# Patient Record
Sex: Male | Born: 1935 | Race: White | Hispanic: No | Marital: Married | Smoking: Former smoker
Health system: Southern US, Community
[De-identification: ages and names within clinical notes are randomized; demographics above are authoritative.]

## PROBLEM LIST (undated history)

## (undated) DIAGNOSIS — I2510 Atherosclerotic heart disease of native coronary artery without angina pectoris: Secondary | ICD-10-CM

## (undated) DIAGNOSIS — E7800 Pure hypercholesterolemia, unspecified: Secondary | ICD-10-CM

## (undated) DIAGNOSIS — E785 Hyperlipidemia, unspecified: Secondary | ICD-10-CM

## (undated) DIAGNOSIS — I219 Acute myocardial infarction, unspecified: Secondary | ICD-10-CM

## (undated) DIAGNOSIS — R351 Nocturia: Secondary | ICD-10-CM

## (undated) DIAGNOSIS — R001 Bradycardia, unspecified: Secondary | ICD-10-CM

## (undated) DIAGNOSIS — D649 Anemia, unspecified: Secondary | ICD-10-CM

## (undated) DIAGNOSIS — R600 Localized edema: Secondary | ICD-10-CM

## (undated) DIAGNOSIS — R0609 Other forms of dyspnea: Secondary | ICD-10-CM

## (undated) DIAGNOSIS — R0600 Dyspnea, unspecified: Secondary | ICD-10-CM

## (undated) DIAGNOSIS — N400 Enlarged prostate without lower urinary tract symptoms: Secondary | ICD-10-CM

## (undated) DIAGNOSIS — M549 Dorsalgia, unspecified: Secondary | ICD-10-CM

## (undated) DIAGNOSIS — I351 Nonrheumatic aortic (valve) insufficiency: Secondary | ICD-10-CM

## (undated) DIAGNOSIS — M25579 Pain in unspecified ankle and joints of unspecified foot: Secondary | ICD-10-CM

## (undated) DIAGNOSIS — I10 Essential (primary) hypertension: Secondary | ICD-10-CM

## (undated) DIAGNOSIS — M79606 Pain in leg, unspecified: Secondary | ICD-10-CM

## (undated) DIAGNOSIS — S42309A Unspecified fracture of shaft of humerus, unspecified arm, initial encounter for closed fracture: Secondary | ICD-10-CM

## (undated) DIAGNOSIS — R002 Palpitations: Secondary | ICD-10-CM

## (undated) DIAGNOSIS — J40 Bronchitis, not specified as acute or chronic: Secondary | ICD-10-CM

## (undated) DIAGNOSIS — IMO0001 Reserved for inherently not codable concepts without codable children: Secondary | ICD-10-CM

## (undated) HISTORY — DX: Localized edema: R60.0

## (undated) HISTORY — DX: Unspecified fracture of shaft of humerus, unspecified arm, initial encounter for closed fracture: S42.309A

## (undated) HISTORY — DX: Essential (primary) hypertension: I10

## (undated) HISTORY — DX: Palpitations: R00.2

## (undated) HISTORY — DX: Bradycardia, unspecified: R00.1

## (undated) HISTORY — DX: Pain in leg, unspecified: M79.606

## (undated) HISTORY — DX: Other forms of dyspnea: R06.09

## (undated) HISTORY — DX: Bronchitis, not specified as acute or chronic: J40

## (undated) HISTORY — PX: CHOLECYSTECTOMY: SHX55

## (undated) HISTORY — DX: Nonrheumatic aortic (valve) insufficiency: I35.1

## (undated) HISTORY — DX: Hyperlipidemia, unspecified: E78.5

## (undated) HISTORY — DX: Reserved for inherently not codable concepts without codable children: IMO0001

## (undated) HISTORY — DX: Acute myocardial infarction, unspecified: I21.9

## (undated) HISTORY — DX: Anemia, unspecified: D64.9

## (undated) HISTORY — DX: Dorsalgia, unspecified: M54.9

## (undated) HISTORY — DX: Benign prostatic hyperplasia without lower urinary tract symptoms: N40.0

## (undated) HISTORY — DX: Pain in unspecified ankle and joints of unspecified foot: M25.579

## (undated) HISTORY — PX: OTHER SURGICAL HISTORY: SHX169

## (undated) HISTORY — DX: Atherosclerotic heart disease of native coronary artery without angina pectoris: I25.10

## (undated) HISTORY — DX: Dyspnea, unspecified: R06.00

## (undated) HISTORY — DX: Pure hypercholesterolemia, unspecified: E78.00

## (undated) HISTORY — DX: Nocturia: R35.1

---

## 1996-06-01 DIAGNOSIS — D649 Anemia, unspecified: Secondary | ICD-10-CM

## 1996-06-01 HISTORY — DX: Anemia, unspecified: D64.9

## 1996-06-01 HISTORY — PX: CARDIAC CATHETERIZATION: SHX172

## 1996-06-04 HISTORY — PX: CORONARY ARTERY BYPASS GRAFT: SHX141

## 1999-11-15 ENCOUNTER — Emergency Department (HOSPITAL_COMMUNITY): Admission: EM | Admit: 1999-11-15 | Discharge: 1999-11-15 | Payer: Self-pay | Admitting: *Deleted

## 2004-02-02 ENCOUNTER — Encounter: Admission: RE | Admit: 2004-02-02 | Discharge: 2004-02-02 | Payer: Self-pay | Admitting: Family Medicine

## 2004-02-02 DIAGNOSIS — J40 Bronchitis, not specified as acute or chronic: Secondary | ICD-10-CM

## 2004-02-02 HISTORY — DX: Bronchitis, not specified as acute or chronic: J40

## 2004-06-16 ENCOUNTER — Emergency Department (HOSPITAL_COMMUNITY): Admission: EM | Admit: 2004-06-16 | Discharge: 2004-06-16 | Payer: Self-pay

## 2012-03-24 DIAGNOSIS — E782 Mixed hyperlipidemia: Secondary | ICD-10-CM | POA: Diagnosis not present

## 2012-03-24 DIAGNOSIS — I2510 Atherosclerotic heart disease of native coronary artery without angina pectoris: Secondary | ICD-10-CM | POA: Diagnosis not present

## 2012-03-24 DIAGNOSIS — I10 Essential (primary) hypertension: Secondary | ICD-10-CM | POA: Diagnosis not present

## 2012-04-02 DIAGNOSIS — I10 Essential (primary) hypertension: Secondary | ICD-10-CM | POA: Diagnosis not present

## 2012-04-02 DIAGNOSIS — I2510 Atherosclerotic heart disease of native coronary artery without angina pectoris: Secondary | ICD-10-CM | POA: Diagnosis not present

## 2012-04-02 DIAGNOSIS — E782 Mixed hyperlipidemia: Secondary | ICD-10-CM | POA: Diagnosis not present

## 2012-06-03 DIAGNOSIS — S4980XA Other specified injuries of shoulder and upper arm, unspecified arm, initial encounter: Secondary | ICD-10-CM | POA: Diagnosis not present

## 2012-06-03 DIAGNOSIS — M542 Cervicalgia: Secondary | ICD-10-CM | POA: Diagnosis not present

## 2012-06-03 DIAGNOSIS — S0993XA Unspecified injury of face, initial encounter: Secondary | ICD-10-CM | POA: Diagnosis not present

## 2012-06-03 DIAGNOSIS — M25519 Pain in unspecified shoulder: Secondary | ICD-10-CM | POA: Diagnosis not present

## 2012-06-03 DIAGNOSIS — M259 Joint disorder, unspecified: Secondary | ICD-10-CM | POA: Diagnosis not present

## 2012-06-03 DIAGNOSIS — M47812 Spondylosis without myelopathy or radiculopathy, cervical region: Secondary | ICD-10-CM | POA: Diagnosis not present

## 2012-06-03 DIAGNOSIS — M19019 Primary osteoarthritis, unspecified shoulder: Secondary | ICD-10-CM | POA: Diagnosis not present

## 2012-06-04 DIAGNOSIS — M62838 Other muscle spasm: Secondary | ICD-10-CM | POA: Diagnosis not present

## 2012-06-04 DIAGNOSIS — M999 Biomechanical lesion, unspecified: Secondary | ICD-10-CM | POA: Diagnosis not present

## 2012-06-04 DIAGNOSIS — M5030 Other cervical disc degeneration, unspecified cervical region: Secondary | ICD-10-CM | POA: Diagnosis not present

## 2012-06-04 DIAGNOSIS — M9981 Other biomechanical lesions of cervical region: Secondary | ICD-10-CM | POA: Diagnosis not present

## 2012-06-08 DIAGNOSIS — M5030 Other cervical disc degeneration, unspecified cervical region: Secondary | ICD-10-CM | POA: Diagnosis not present

## 2012-06-08 DIAGNOSIS — M999 Biomechanical lesion, unspecified: Secondary | ICD-10-CM | POA: Diagnosis not present

## 2012-06-08 DIAGNOSIS — M62838 Other muscle spasm: Secondary | ICD-10-CM | POA: Diagnosis not present

## 2012-06-08 DIAGNOSIS — M9981 Other biomechanical lesions of cervical region: Secondary | ICD-10-CM | POA: Diagnosis not present

## 2012-06-09 DIAGNOSIS — M999 Biomechanical lesion, unspecified: Secondary | ICD-10-CM | POA: Diagnosis not present

## 2012-06-09 DIAGNOSIS — M5030 Other cervical disc degeneration, unspecified cervical region: Secondary | ICD-10-CM | POA: Diagnosis not present

## 2012-06-09 DIAGNOSIS — M9981 Other biomechanical lesions of cervical region: Secondary | ICD-10-CM | POA: Diagnosis not present

## 2012-06-09 DIAGNOSIS — M62838 Other muscle spasm: Secondary | ICD-10-CM | POA: Diagnosis not present

## 2012-07-21 DIAGNOSIS — M542 Cervicalgia: Secondary | ICD-10-CM | POA: Diagnosis not present

## 2012-07-21 DIAGNOSIS — M25519 Pain in unspecified shoulder: Secondary | ICD-10-CM | POA: Diagnosis not present

## 2012-07-30 DIAGNOSIS — M5000 Cervical disc disorder with myelopathy, unspecified cervical region: Secondary | ICD-10-CM | POA: Diagnosis not present

## 2012-09-14 DIAGNOSIS — Z23 Encounter for immunization: Secondary | ICD-10-CM | POA: Diagnosis not present

## 2012-11-19 DIAGNOSIS — I2510 Atherosclerotic heart disease of native coronary artery without angina pectoris: Secondary | ICD-10-CM | POA: Diagnosis not present

## 2012-11-19 DIAGNOSIS — M79609 Pain in unspecified limb: Secondary | ICD-10-CM | POA: Diagnosis not present

## 2012-11-25 DIAGNOSIS — M1710 Unilateral primary osteoarthritis, unspecified knee: Secondary | ICD-10-CM | POA: Diagnosis not present

## 2012-11-25 DIAGNOSIS — M79609 Pain in unspecified limb: Secondary | ICD-10-CM | POA: Diagnosis not present

## 2012-11-25 DIAGNOSIS — M19079 Primary osteoarthritis, unspecified ankle and foot: Secondary | ICD-10-CM | POA: Diagnosis not present

## 2012-11-25 DIAGNOSIS — M259 Joint disorder, unspecified: Secondary | ICD-10-CM | POA: Diagnosis not present

## 2012-12-25 DIAGNOSIS — J40 Bronchitis, not specified as acute or chronic: Secondary | ICD-10-CM | POA: Diagnosis not present

## 2012-12-25 DIAGNOSIS — R509 Fever, unspecified: Secondary | ICD-10-CM | POA: Diagnosis not present

## 2013-02-19 DIAGNOSIS — H2510 Age-related nuclear cataract, unspecified eye: Secondary | ICD-10-CM | POA: Diagnosis not present

## 2013-02-19 DIAGNOSIS — H524 Presbyopia: Secondary | ICD-10-CM | POA: Diagnosis not present

## 2013-03-09 DIAGNOSIS — M999 Biomechanical lesion, unspecified: Secondary | ICD-10-CM | POA: Diagnosis not present

## 2013-03-09 DIAGNOSIS — M62838 Other muscle spasm: Secondary | ICD-10-CM | POA: Diagnosis not present

## 2013-03-09 DIAGNOSIS — M5030 Other cervical disc degeneration, unspecified cervical region: Secondary | ICD-10-CM | POA: Diagnosis not present

## 2013-03-09 DIAGNOSIS — M9981 Other biomechanical lesions of cervical region: Secondary | ICD-10-CM | POA: Diagnosis not present

## 2013-03-10 DIAGNOSIS — M5030 Other cervical disc degeneration, unspecified cervical region: Secondary | ICD-10-CM | POA: Diagnosis not present

## 2013-03-10 DIAGNOSIS — M9981 Other biomechanical lesions of cervical region: Secondary | ICD-10-CM | POA: Diagnosis not present

## 2013-03-10 DIAGNOSIS — M999 Biomechanical lesion, unspecified: Secondary | ICD-10-CM | POA: Diagnosis not present

## 2013-03-10 DIAGNOSIS — M62838 Other muscle spasm: Secondary | ICD-10-CM | POA: Diagnosis not present

## 2013-03-11 DIAGNOSIS — M62838 Other muscle spasm: Secondary | ICD-10-CM | POA: Diagnosis not present

## 2013-03-11 DIAGNOSIS — M999 Biomechanical lesion, unspecified: Secondary | ICD-10-CM | POA: Diagnosis not present

## 2013-03-11 DIAGNOSIS — M5030 Other cervical disc degeneration, unspecified cervical region: Secondary | ICD-10-CM | POA: Diagnosis not present

## 2013-03-11 DIAGNOSIS — M9981 Other biomechanical lesions of cervical region: Secondary | ICD-10-CM | POA: Diagnosis not present

## 2013-03-16 DIAGNOSIS — M9981 Other biomechanical lesions of cervical region: Secondary | ICD-10-CM | POA: Diagnosis not present

## 2013-03-16 DIAGNOSIS — M62838 Other muscle spasm: Secondary | ICD-10-CM | POA: Diagnosis not present

## 2013-03-16 DIAGNOSIS — M5030 Other cervical disc degeneration, unspecified cervical region: Secondary | ICD-10-CM | POA: Diagnosis not present

## 2013-03-16 DIAGNOSIS — M999 Biomechanical lesion, unspecified: Secondary | ICD-10-CM | POA: Diagnosis not present

## 2013-03-18 DIAGNOSIS — H353190 Nonexudative age-related macular degeneration, unspecified eye, stage unspecified: Secondary | ICD-10-CM | POA: Diagnosis not present

## 2013-03-18 DIAGNOSIS — H25019 Cortical age-related cataract, unspecified eye: Secondary | ICD-10-CM | POA: Diagnosis not present

## 2013-03-23 DIAGNOSIS — M9981 Other biomechanical lesions of cervical region: Secondary | ICD-10-CM | POA: Diagnosis not present

## 2013-03-23 DIAGNOSIS — M999 Biomechanical lesion, unspecified: Secondary | ICD-10-CM | POA: Diagnosis not present

## 2013-03-23 DIAGNOSIS — M62838 Other muscle spasm: Secondary | ICD-10-CM | POA: Diagnosis not present

## 2013-03-23 DIAGNOSIS — M5030 Other cervical disc degeneration, unspecified cervical region: Secondary | ICD-10-CM | POA: Diagnosis not present

## 2013-03-24 DIAGNOSIS — J069 Acute upper respiratory infection, unspecified: Secondary | ICD-10-CM | POA: Diagnosis not present

## 2013-03-24 DIAGNOSIS — R109 Unspecified abdominal pain: Secondary | ICD-10-CM | POA: Diagnosis not present

## 2013-04-07 DIAGNOSIS — R001 Bradycardia, unspecified: Secondary | ICD-10-CM

## 2013-04-07 DIAGNOSIS — I2510 Atherosclerotic heart disease of native coronary artery without angina pectoris: Secondary | ICD-10-CM | POA: Diagnosis not present

## 2013-04-07 DIAGNOSIS — I10 Essential (primary) hypertension: Secondary | ICD-10-CM | POA: Diagnosis not present

## 2013-04-07 DIAGNOSIS — E782 Mixed hyperlipidemia: Secondary | ICD-10-CM | POA: Diagnosis not present

## 2013-04-07 HISTORY — DX: Bradycardia, unspecified: R00.1

## 2013-04-13 DIAGNOSIS — M9981 Other biomechanical lesions of cervical region: Secondary | ICD-10-CM | POA: Diagnosis not present

## 2013-04-13 DIAGNOSIS — M5030 Other cervical disc degeneration, unspecified cervical region: Secondary | ICD-10-CM | POA: Diagnosis not present

## 2013-04-13 DIAGNOSIS — M999 Biomechanical lesion, unspecified: Secondary | ICD-10-CM | POA: Diagnosis not present

## 2013-04-13 DIAGNOSIS — M62838 Other muscle spasm: Secondary | ICD-10-CM | POA: Diagnosis not present

## 2013-04-20 ENCOUNTER — Other Ambulatory Visit: Payer: Self-pay | Admitting: *Deleted

## 2013-04-20 MED ORDER — METOPROLOL SUCCINATE ER 25 MG PO TB24: 25.0000 mg | ORAL_TABLET | ORAL | Status: DC

## 2013-04-20 NOTE — Telephone Encounter (Signed)
Electronic refill for metoprolol

## 2013-04-21 ENCOUNTER — Other Ambulatory Visit: Payer: Self-pay | Admitting: *Deleted

## 2013-04-21 MED ORDER — FOSINOPRIL SODIUM 10 MG PO TABS: 10.0000 mg | ORAL_TABLET | Freq: Every day | ORAL | Status: DC

## 2013-05-11 DIAGNOSIS — IMO0002 Reserved for concepts with insufficient information to code with codable children: Secondary | ICD-10-CM | POA: Diagnosis not present

## 2013-05-11 DIAGNOSIS — M9981 Other biomechanical lesions of cervical region: Secondary | ICD-10-CM | POA: Diagnosis not present

## 2013-05-11 DIAGNOSIS — M999 Biomechanical lesion, unspecified: Secondary | ICD-10-CM | POA: Diagnosis not present

## 2013-05-11 DIAGNOSIS — M5030 Other cervical disc degeneration, unspecified cervical region: Secondary | ICD-10-CM | POA: Diagnosis not present

## 2013-05-12 DIAGNOSIS — M9981 Other biomechanical lesions of cervical region: Secondary | ICD-10-CM | POA: Diagnosis not present

## 2013-05-12 DIAGNOSIS — IMO0002 Reserved for concepts with insufficient information to code with codable children: Secondary | ICD-10-CM | POA: Diagnosis not present

## 2013-05-12 DIAGNOSIS — M5030 Other cervical disc degeneration, unspecified cervical region: Secondary | ICD-10-CM | POA: Diagnosis not present

## 2013-05-12 DIAGNOSIS — M999 Biomechanical lesion, unspecified: Secondary | ICD-10-CM | POA: Diagnosis not present

## 2013-05-13 DIAGNOSIS — M9981 Other biomechanical lesions of cervical region: Secondary | ICD-10-CM | POA: Diagnosis not present

## 2013-05-13 DIAGNOSIS — M5030 Other cervical disc degeneration, unspecified cervical region: Secondary | ICD-10-CM | POA: Diagnosis not present

## 2013-05-13 DIAGNOSIS — M999 Biomechanical lesion, unspecified: Secondary | ICD-10-CM | POA: Diagnosis not present

## 2013-05-13 DIAGNOSIS — IMO0002 Reserved for concepts with insufficient information to code with codable children: Secondary | ICD-10-CM | POA: Diagnosis not present

## 2013-05-19 DIAGNOSIS — M9981 Other biomechanical lesions of cervical region: Secondary | ICD-10-CM | POA: Diagnosis not present

## 2013-05-19 DIAGNOSIS — M999 Biomechanical lesion, unspecified: Secondary | ICD-10-CM | POA: Diagnosis not present

## 2013-05-19 DIAGNOSIS — M5030 Other cervical disc degeneration, unspecified cervical region: Secondary | ICD-10-CM | POA: Diagnosis not present

## 2013-05-19 DIAGNOSIS — IMO0002 Reserved for concepts with insufficient information to code with codable children: Secondary | ICD-10-CM | POA: Diagnosis not present

## 2013-05-20 DIAGNOSIS — IMO0002 Reserved for concepts with insufficient information to code with codable children: Secondary | ICD-10-CM | POA: Diagnosis not present

## 2013-05-20 DIAGNOSIS — M5030 Other cervical disc degeneration, unspecified cervical region: Secondary | ICD-10-CM | POA: Diagnosis not present

## 2013-05-20 DIAGNOSIS — M999 Biomechanical lesion, unspecified: Secondary | ICD-10-CM | POA: Diagnosis not present

## 2013-05-20 DIAGNOSIS — M9981 Other biomechanical lesions of cervical region: Secondary | ICD-10-CM | POA: Diagnosis not present

## 2013-05-24 DIAGNOSIS — M999 Biomechanical lesion, unspecified: Secondary | ICD-10-CM | POA: Diagnosis not present

## 2013-05-24 DIAGNOSIS — M5030 Other cervical disc degeneration, unspecified cervical region: Secondary | ICD-10-CM | POA: Diagnosis not present

## 2013-05-24 DIAGNOSIS — M9981 Other biomechanical lesions of cervical region: Secondary | ICD-10-CM | POA: Diagnosis not present

## 2013-05-24 DIAGNOSIS — IMO0002 Reserved for concepts with insufficient information to code with codable children: Secondary | ICD-10-CM | POA: Diagnosis not present

## 2013-05-26 DIAGNOSIS — M5030 Other cervical disc degeneration, unspecified cervical region: Secondary | ICD-10-CM | POA: Diagnosis not present

## 2013-05-26 DIAGNOSIS — M999 Biomechanical lesion, unspecified: Secondary | ICD-10-CM | POA: Diagnosis not present

## 2013-05-26 DIAGNOSIS — M9981 Other biomechanical lesions of cervical region: Secondary | ICD-10-CM | POA: Diagnosis not present

## 2013-05-26 DIAGNOSIS — IMO0002 Reserved for concepts with insufficient information to code with codable children: Secondary | ICD-10-CM | POA: Diagnosis not present

## 2013-05-27 DIAGNOSIS — IMO0002 Reserved for concepts with insufficient information to code with codable children: Secondary | ICD-10-CM | POA: Diagnosis not present

## 2013-05-27 DIAGNOSIS — M5030 Other cervical disc degeneration, unspecified cervical region: Secondary | ICD-10-CM | POA: Diagnosis not present

## 2013-05-27 DIAGNOSIS — M999 Biomechanical lesion, unspecified: Secondary | ICD-10-CM | POA: Diagnosis not present

## 2013-05-27 DIAGNOSIS — M9981 Other biomechanical lesions of cervical region: Secondary | ICD-10-CM | POA: Diagnosis not present

## 2013-06-04 DIAGNOSIS — M545 Low back pain, unspecified: Secondary | ICD-10-CM | POA: Diagnosis not present

## 2013-06-04 DIAGNOSIS — J019 Acute sinusitis, unspecified: Secondary | ICD-10-CM | POA: Diagnosis not present

## 2013-09-16 DIAGNOSIS — Z23 Encounter for immunization: Secondary | ICD-10-CM | POA: Diagnosis not present

## 2013-09-27 DIAGNOSIS — J329 Chronic sinusitis, unspecified: Secondary | ICD-10-CM | POA: Diagnosis not present

## 2013-09-27 DIAGNOSIS — F411 Generalized anxiety disorder: Secondary | ICD-10-CM | POA: Diagnosis not present

## 2013-12-15 DIAGNOSIS — F411 Generalized anxiety disorder: Secondary | ICD-10-CM | POA: Diagnosis not present

## 2013-12-15 DIAGNOSIS — J40 Bronchitis, not specified as acute or chronic: Secondary | ICD-10-CM | POA: Diagnosis not present

## 2013-12-15 DIAGNOSIS — R059 Cough, unspecified: Secondary | ICD-10-CM | POA: Diagnosis not present

## 2013-12-15 DIAGNOSIS — R05 Cough: Secondary | ICD-10-CM | POA: Diagnosis not present

## 2013-12-22 DIAGNOSIS — I2510 Atherosclerotic heart disease of native coronary artery without angina pectoris: Secondary | ICD-10-CM | POA: Diagnosis not present

## 2013-12-22 DIAGNOSIS — R42 Dizziness and giddiness: Secondary | ICD-10-CM | POA: Diagnosis not present

## 2013-12-22 DIAGNOSIS — H6120 Impacted cerumen, unspecified ear: Secondary | ICD-10-CM | POA: Diagnosis not present

## 2013-12-22 DIAGNOSIS — R110 Nausea: Secondary | ICD-10-CM | POA: Diagnosis not present

## 2014-03-24 ENCOUNTER — Telehealth: Payer: Self-pay | Admitting: Cardiovascular Disease

## 2014-03-24 MED ORDER — FOSINOPRIL SODIUM 10 MG PO TABS: 10.0000 mg | ORAL_TABLET | Freq: Every day | ORAL | Status: DC

## 2014-03-24 NOTE — Telephone Encounter (Signed)
Rx was sent to pharmacy electronically. 

## 2014-03-24 NOTE — Telephone Encounter (Signed)
Need refill on his Fosinopril 10mg  #90.

## 2014-03-25 ENCOUNTER — Telehealth: Payer: Self-pay | Admitting: Cardiovascular Disease

## 2014-03-25 NOTE — Telephone Encounter (Signed)
Forwarded to kathryn 

## 2014-03-25 NOTE — Telephone Encounter (Signed)
Returning [place] call from yesterday.

## 2014-03-28 NOTE — Telephone Encounter (Signed)
Would like two handicap forms filled out to get a sticker for his two cars.  Will bring by this PM and we can mail back to him when complete.

## 2014-03-28 NOTE — Telephone Encounter (Signed)
Pt called again,still waiting on his handicap papers.

## 2014-03-30 ENCOUNTER — Other Ambulatory Visit: Payer: Self-pay

## 2014-03-30 MED ORDER — FOSINOPRIL SODIUM 10 MG PO TABS: 10.0000 mg | ORAL_TABLET | Freq: Every day | ORAL | Status: DC

## 2014-03-30 NOTE — Telephone Encounter (Signed)
Rx was sent to pharmacy electronically. 

## 2014-03-31 ENCOUNTER — Telehealth: Payer: Self-pay | Admitting: Cardiovascular Disease

## 2014-03-31 NOTE — Telephone Encounter (Signed)
Please call-pt says he still have not gotten his handicap forms back.

## 2014-03-31 NOTE — Telephone Encounter (Signed)
Message forwarded to Kathryn, RN.  

## 2014-03-31 NOTE — Telephone Encounter (Signed)
Lmom  I was under the understanding that he was going to bring by another form for me to fill out and then I was to mail the forms to him.  Does he want me to mail the one handicapp placard form that I have or wait on the other.

## 2014-04-01 NOTE — Telephone Encounter (Signed)
Placard mailed

## 2014-04-01 NOTE — Telephone Encounter (Signed)
Pt was calling in regards to his placard. He wants you to know that the form you have now he stated that he wants 2 of the placards, but he misread the paperwork. He had 2 separate forms, one is not due until June. He wants the one that he gave to you be mailed out and he will bring the other one in at his next appointment.  JB

## 2014-04-11 ENCOUNTER — Telehealth: Payer: Self-pay | Admitting: *Deleted

## 2014-04-11 NOTE — Telephone Encounter (Signed)
Pt did receive the form and wanted you to know. Pt stated that he wanted you to mail the other form to him as well.  JB

## 2014-04-12 ENCOUNTER — Telehealth: Payer: Self-pay | Admitting: *Deleted

## 2014-04-12 NOTE — Telephone Encounter (Signed)
Second handicap placard form mailed to patient per his request

## 2014-04-12 NOTE — Telephone Encounter (Signed)
Second placard mailed

## 2014-05-09 ENCOUNTER — Encounter: Payer: Self-pay | Admitting: Cardiovascular Disease

## 2014-05-10 ENCOUNTER — Ambulatory Visit (INDEPENDENT_AMBULATORY_CARE_PROVIDER_SITE_OTHER): Payer: Medicare Other | Admitting: Cardiovascular Disease

## 2014-05-10 ENCOUNTER — Encounter: Payer: Self-pay | Admitting: Cardiovascular Disease

## 2014-05-10 VITALS — BP 114/68 | HR 50 | Ht 69.0 in | Wt 173.8 lb

## 2014-05-10 DIAGNOSIS — E785 Hyperlipidemia, unspecified: Secondary | ICD-10-CM | POA: Diagnosis not present

## 2014-05-10 DIAGNOSIS — I10 Essential (primary) hypertension: Secondary | ICD-10-CM | POA: Diagnosis not present

## 2014-05-10 DIAGNOSIS — I2510 Atherosclerotic heart disease of native coronary artery without angina pectoris: Secondary | ICD-10-CM

## 2014-05-10 NOTE — Assessment & Plan Note (Signed)
History of CAD status post coronary artery bypass grafting in 1997 with a LIMA to his LAD, vein to diagonal branch, marginal branch, PDA and distal circumflex coronary artery. His last Myoview stress test performed 03/24/12 with nonischemic. He denies chest pain or shortness of breath

## 2014-05-10 NOTE — Progress Notes (Signed)
05/10/2014 Randall Compton   Feb 01, 1932  [id number]  Primary Physician No PCP Per Patient Primary Cardiologist: Lorretta Harp MD Renae Gloss   HPI:  The patient is a very pleasant, 80 year old, mildly overweight, married Caucasian male, father of 16, grandfather to 3 grandchildren who I saw a year ago. I have been taking care of him since 1997. He has a history of coronary artery disease status post bypass grafting in 1997 with a LIMA to his LAD, a vein to the diagonal branch, a marginal branch, PDA, as well as distal circumflex. His other problems include hypertension and hyperlipidemia. He denies chest pain or shortness of breath. He had a Myoview performed 03/24/12  was nonischemic. He does have mildly elevated cholesterol but he is statin intolerant.    Current Outpatient Prescriptions  Medication Sig Dispense Refill  . Coenzyme Q10 (CO Q 10 PO) Take 1 capsule by mouth daily.      . diazepam (VALIUM) 5 MG tablet Take 5 mg by mouth as needed for anxiety.      . fosinopril (MONOPRIL) 10 MG tablet Take 1 tablet (10 mg total) by mouth daily. MUST KEEP APPOINTMENT WITH DR. Gwenlyn Found 6/2  90 tablet  0  . loratadine (CLARITIN) 10 MG tablet Take 10 mg by mouth daily as needed for allergies.      . metoprolol succinate (TOPROL-XL) 25 MG 24 hr tablet Take 12.5 mg by mouth daily.      . Multiple Vitamin (MULTIVITAMIN) capsule Take 1 capsule by mouth daily.      [name] omeprazole (PRILOSEC) 40 MG capsule Take 40 mg by mouth daily as needed.       [name] VITAMIN D, CHOLECALCIFEROL, PO Take 1 capsule by mouth daily.       No current facility-administered medications for this visit.    Allergies  Allergen Reactions  . Asa [Aspirin] Other (See Comments)    GI upset  . Lovaza [Omega-3-Acid Ethyl Esters]   . Statins   . Tricor [Fenofibrate] Other (See Comments)    Joint swelling    History   Social History  . Marital Status: Married    Spouse Name: N/A    Number of Children: N/A  . Years  of Education: N/A   Occupational History  . Not on file.   Social History Main Topics  . Smoking status: Former Smoker    Quit date: 12/09/1972  . Smokeless tobacco: Not on file  . Alcohol Use: 1.2 oz/week    2 Cans of beer per week     Comment: Not often  . Drug Use: No  . Sexual Activity: Not on file   Other Topics Concern  . Not on file   Social History Narrative  . No narrative on file     Review of Systems: General: negative for chills, fever, night sweats or weight changes.  Cardiovascular: negative for chest pain, dyspnea on exertion, edema, orthopnea, palpitations, paroxysmal nocturnal dyspnea or shortness of breath Dermatological: negative for rash Respiratory: negative for cough or wheezing Urologic: negative for hematuria Abdominal: negative for nausea, vomiting, diarrhea, bright red blood per rectum, melena, or hematemesis Neurologic: negative for visual changes, syncope, or dizziness All other systems reviewed and are otherwise negative except as noted above.    Blood pressure 114/68, pulse 50, height 5\' 9"  (1.753 m), weight 173 lb 12.8 oz (78.835 kg).  General appearance: alert and no distress Neck: no adenopathy, no carotid bruit, no JVD, supple, symmetrical, trachea  midline and thyroid not enlarged, symmetric, no tenderness/mass/nodules Lungs: clear to auscultation bilaterally Heart: regular rate and rhythm, S1, S2 normal, no murmur, click, rub or gallop Extremities: extremities normal, atraumatic, no cyanosis or edema  EKG sinus bradycardia at 50  ASSESSMENT AND PLAN:   Hyperlipidemia Statin intolerant  Essential hypertension Controlled on current medications  Coronary artery disease History of CAD status post coronary artery bypass grafting in 1997 with a LIMA to his LAD, vein to diagonal branch, marginal branch, PDA and distal circumflex coronary artery. His last Myoview stress test performed 03/24/12 with nonischemic. He denies chest pain or  shortness of breath      Lorretta Harp MD [facility], [facility] 05/10/2014 10:49 AM

## 2014-05-10 NOTE — Assessment & Plan Note (Signed)
Statin intolerant 

## 2014-05-10 NOTE — Patient Instructions (Signed)
Your physician recommends that you schedule a follow-up appointment in: 1 Year  

## 2014-05-10 NOTE — Assessment & Plan Note (Signed)
Controlled on current medications 

## 2014-06-27 ENCOUNTER — Other Ambulatory Visit: Payer: Self-pay | Admitting: Cardiovascular Disease

## 2014-06-27 NOTE — Telephone Encounter (Signed)
Rx refill sent to pharmacy. 

## 2014-08-22 ENCOUNTER — Emergency Department (INDEPENDENT_AMBULATORY_CARE_PROVIDER_SITE_OTHER)
Admission: EM | Admit: 2014-08-22 | Discharge: 2014-08-22 | Disposition: A | Discharge status: Home / Self Care | Payer: Medicare Other | Source: Home / Self Care

## 2014-08-22 ENCOUNTER — Encounter: Payer: Self-pay | Admitting: Emergency Medicine

## 2014-08-22 DIAGNOSIS — J0101 Acute recurrent maxillary sinusitis: Secondary | ICD-10-CM

## 2014-08-22 DIAGNOSIS — J0100 Acute maxillary sinusitis, unspecified: Secondary | ICD-10-CM

## 2014-08-22 MED ORDER — DIAZEPAM 5 MG PO TABS
ORAL_TABLET | ORAL | Status: DC
Start: 2014-08-22 — End: 2014-10-17

## 2014-08-22 MED ORDER — AMOXICILLIN-POT CLAVULANATE 875-125 MG PO TABS: 1.0000 | ORAL_TABLET | Freq: Two times a day (BID) | ORAL | Status: DC

## 2014-08-22 NOTE — Discharge Instructions (Signed)

## 2014-08-22 NOTE — ED Notes (Signed)
Randall Compton c/o 4 days of congestion, "raw" itchy throat, watery eyes and tinnitus. Denies fever.

## 2014-08-22 NOTE — ED Provider Notes (Signed)
CSN: [id number]     Arrival date & time 08/22/14  1420 History   None    Chief Complaint  Patient presents with  . Sinus Problem   (Consider location/radiation/quality/duration/timing/severity/associated sxs/prior Treatment) Patient is a 80 y.o. male presenting with sinus complaint. The history is provided by the patient. No language interpreter was used.  Sinus Problem This is a new problem. The current episode started more than 2 days ago. The problem occurs constantly. The problem has been gradually worsening. Pertinent negatives include no shortness of breath. Nothing aggravates the symptoms. Nothing relieves the symptoms. He has tried nothing for the symptoms.  Pt has a sore throat and sinus drainage.  Pt is also out of valium.   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 CAD (coronary artery disease)     s/p bypass grafting 06/01/96 with a LIMA to his LAD, a vein to the diagonal branch, a marginal branch, PDA, as well as distal circumflex. ECHO 07/21/96= Normal LV size, wall thickness & global systolic function, EF 40-98%.  Abnormal septal motion compatible with post-operative state w/o major wall motion abnormalities. Aortic sclerosis w/mild aortic regurgitation. Myoview 03/24/12 was non ischemic.  [name] Hypertension   . Bradycardia 04/07/13    EKG 04/07/13 showed sinus bradycardia at 54 bpm.  . Leg pain   . Ankle pain   . Back pain     Chronic  . Broken arm     Had pin placed  . Bronchitis 02/02/2004    Chest CT = Heart vascularity are normal. There is evidence of a previous CABG with some tortuosity of the thoracic aorta. There are no infiltrates or effusions, or other acute abnormalities.  . Postoperative anemia 06/01/96  . MI (myocardial infarction)     Prior to CABG 06/01/1996  . BPH (benign prostatic hyperplasia)   . DOE (dyspnea on exertion)   . Aortic sclerosis     With mild aortic regurgitation  . Mild aortic regurgitation     With aortic sclerosis   . Nocturia     Of 0-1  . Lower extremity edema     Late in the evening  . Palpitations     Occasional  . Hyperlipemia     Statin intolerant.  . Elevated cholesterol     Mild, statin intolerant.   Past Surgical History  Procedure Laterality Date  . Coronary artery bypass graft  06/04/96    Dr. Cyndia Bent placed LIMA to the LAD, SVG to the DX, SVG to the OM, & SVG to the PDA - distal CFX.  . Cardiac catheterization  06/01/96    60-70% distal (L) main, totally occluded LAD prior to the take off of the (L) of the diagonal w/thrombus present, multiple 60% lesions in the circumflex, & a totally occluded (R). Had a good collateral support to the distal RCA & distal LAD distribution but did have significant LV dysfunction w/EF of 30% & significant akinesia in the LAD distribution. Renals, distal aorta & iliacs were normal.   . Cholecystectomy    . Other surgical history      Broken arm, had pin placed   Family History  Problem Relation Age of Onset  . Heart disease Mother     Heart problems in her 64s (Pt did not specify)  . Hypertension Father    History  Substance Use Topics  . Smoking status: Former Smoker    Quit date: 12/09/1972  . Smokeless tobacco: Never  Used  . Alcohol Use: 1.2 oz/week    2 Cans of beer per week     Comment: Not often    Review of Systems  Respiratory: Positive for cough. Negative for shortness of breath.   All other systems reviewed and are negative.   Allergies  Asa; Lovaza; Statins; and Tricor  Home Medications   Prior to Admission medications   Medication Sig Start Date End Date Taking? Authorizing Provider  Coenzyme Q10 (CO Q 10 PO) Take 1 capsule by mouth daily.    Historical Provider, MD  diazepam (VALIUM) 5 MG tablet Take 5 mg by mouth as needed for anxiety.    Historical Provider, MD  fosinopril (MONOPRIL) 10 MG tablet Take 1 tablet (10 mg total) by mouth daily. 06/27/14   Lorretta Harp, MD  loratadine (CLARITIN) 10 MG tablet Take 10 mg by  mouth daily as needed for allergies.    Historical Provider, MD  metoprolol succinate (TOPROL-XL) 25 MG 24 hr tablet Take 12.5 mg by mouth daily. 04/20/13   Lorretta Harp, MD  Multiple Vitamin (MULTIVITAMIN) capsule Take 1 capsule by mouth daily.    Historical Provider, MD  omeprazole (PRILOSEC) 40 MG capsule Take 40 mg by mouth daily as needed.     Historical Provider, MD  VITAMIN D, CHOLECALCIFEROL, PO Take 1 capsule by mouth daily.    Historical Provider, MD   BP 143/51  Pulse 57  Temp(Src) 97.9 F (36.6 C) (Oral)  Resp 14  Ht 5\' 9"  (1.753 m)  Wt 178 lb (80.74 kg)  BMI 26.27 kg/m2  SpO2 98% Physical Exam  Nursing note and vitals reviewed. Constitutional: He is oriented to person, place, and time. He appears well-developed and well-nourished.  HENT:  Head: Normocephalic and atraumatic.  Eyes: EOM are normal. Pupils are equal, round, and reactive to light.  Neck: Normal range of motion.  Cardiovascular: Normal rate, regular rhythm and normal heart sounds.   Pulmonary/Chest: Effort normal.  Abdominal: Soft. He exhibits no distension.  Musculoskeletal: Normal range of motion.  Neurological: He is alert and oriented to person, place, and time.  Skin: Skin is warm.  Psychiatric: He has a normal mood and affect.    ED Course  Procedures (including critical care time) Labs Review Labs Reviewed - No data to display  Imaging Review No results found.   MDM   1. Acute recurrent maxillary sinusitis   augmentin Valium 5mg . Referral to [place] [facility] MD's    Fransico Meadow, PA-C 08/22/14 1500

## 2014-08-25 NOTE — ED Provider Notes (Signed)
Medical history/examination/treatment/procedure(s) were performed by non-physician provider and as supervising physician I was immediately available for consultation/collaboration.   Jacqulyn Cane, MD 08/25/14 [phone]

## 2014-09-14 DIAGNOSIS — Z23 Encounter for immunization: Secondary | ICD-10-CM | POA: Diagnosis not present

## 2014-09-15 ENCOUNTER — Other Ambulatory Visit: Payer: Self-pay | Admitting: Cardiovascular Disease

## 2014-09-19 NOTE — Telephone Encounter (Signed)
Rx was sent to pharmacy electronically. 

## 2014-09-28 ENCOUNTER — Telehealth: Payer: Self-pay | Admitting: Cardiovascular Disease

## 2014-09-28 MED ORDER — METOPROLOL SUCCINATE ER 25 MG PO TB24: 12.5000 mg | ORAL_TABLET | Freq: Every day | ORAL | Status: DC

## 2014-09-28 NOTE — Telephone Encounter (Signed)
Please call,pt wants to know about his Metoprolol, He usually get #90 now he can only get #15.

## 2014-09-28 NOTE — Telephone Encounter (Signed)
PATIENT IS REQUESTING A 90 DAY SUPPLY OF METOPROLOL SUCC. 12.5 MG DAILY.  PATIENT WILL TAKE 1/2 TABLET OF 25 MG  A DAY.

## 2014-10-06 ENCOUNTER — Encounter: Payer: Self-pay | Admitting: Emergency Medicine

## 2014-10-06 ENCOUNTER — Emergency Department (INDEPENDENT_AMBULATORY_CARE_PROVIDER_SITE_OTHER)
Admission: EM | Admit: 2014-10-06 | Discharge: 2014-10-06 | Disposition: A | Discharge status: Home / Self Care | Payer: Medicare Other | Source: Home / Self Care | Attending: Emergency Medicine | Admitting: Emergency Medicine

## 2014-10-06 DIAGNOSIS — J301 Allergic rhinitis due to pollen: Secondary | ICD-10-CM | POA: Diagnosis not present

## 2014-10-06 DIAGNOSIS — J0100 Acute maxillary sinusitis, unspecified: Secondary | ICD-10-CM

## 2014-10-06 MED ORDER — AZITHROMYCIN 250 MG PO TABS: ORAL_TABLET | ORAL | Status: DC

## 2014-10-06 MED ORDER — FLUTICASONE PROPIONATE 50 MCG/ACT NA SUSP: NASAL | Status: DC

## 2014-10-06 NOTE — ED Provider Notes (Signed)
CSN: [id number]     Arrival date & time 10/06/14  1402 History   First MD Initiated Contact with Patient 10/06/14 1420     Chief Complaint  Patient presents with  . Sinus Problem    HPI SINUSITIS  Onset: 4 days Facial/sinus pressure with mild discolored nasal mucus.    Severity: moderate, progressively worsening. Tried OTC Claritin , which helped some allergy symptoms but causes over drying if he takes every day. He recalls that in the past, Flonase has helped, but he requests a refill today because he's run out  He was seen here in urgent care by a different provider a month and a half ago for sinusitis, was prescribed Augmentin, and had mild throat swelling, so he stopped the Augmentin and the swelling resolved without sequelae.--We will note in chart now that he has Augmentin/amoxicillin allergy.  Symptoms:  + Fever  + URI prodrome with nasal congestion Denies swollen neck glands + mild Sinus Headache + mild bilateral ear pressure  Positive, seasonal Allergy symptoms No significant Sore Throat No eye symptoms, except mild itchy watery eyes     No significant Cough No chest pain No shortness of breath  No wheezing  No Abdominal Pain No Nausea No Vomiting No diarrhea  No Myalgias No focal neurologic symptoms No syncope No Rash  No Urinary symptoms   Past Medical History  Diagnosis Date  . CAD (coronary artery disease)     s/p bypass grafting 06/01/96 with a LIMA to his LAD, a vein to the diagonal branch, a marginal branch, PDA, as well as distal circumflex. ECHO 07/21/96= Normal LV size, wall thickness & global systolic function, EF 54-00%.  Abnormal septal motion compatible with post-operative state w/o major wall motion abnormalities. Aortic sclerosis w/mild aortic regurgitation. Myoview 03/24/12 was non ischemic.  [name] Hypertension   . Bradycardia 04/07/13    EKG 04/07/13 showed sinus bradycardia at 54 bpm.  . Leg pain   . Ankle pain   . Back pain     Chronic  .  Broken arm     Had pin placed  . Bronchitis 02/02/2004    Chest CT = Heart vascularity are normal. There is evidence of a previous CABG with some tortuosity of the thoracic aorta. There are no infiltrates or effusions, or other acute abnormalities.  . Postoperative anemia 06/01/96  . MI (myocardial infarction)     Prior to CABG 06/01/1996  . BPH (benign prostatic hyperplasia)   . DOE (dyspnea on exertion)   . Aortic sclerosis     With mild aortic regurgitation  . Mild aortic regurgitation     With aortic sclerosis  . Nocturia     Of 0-1  . Lower extremity edema     Late in the evening  . Palpitations     Occasional  . Hyperlipemia     Statin intolerant.  . Elevated cholesterol     Mild, statin intolerant.   Past Surgical History  Procedure Laterality Date  . Coronary artery bypass graft  06/04/96    Dr. Cyndia Bent placed LIMA to the LAD, SVG to the DX, SVG to the OM, & SVG to the PDA - distal CFX.  . Cardiac catheterization  06/01/96    60-70% distal (L) main, totally occluded LAD prior to the take off of the (L) of the diagonal w/thrombus present, multiple 60% lesions in the circumflex, & a totally occluded (R). Had a good collateral support to the distal RCA & distal LAD distribution  but did have significant LV dysfunction w/EF of 30% & significant akinesia in the LAD distribution. Renals, distal aorta & iliacs were normal.   . Cholecystectomy    . Other surgical history      Broken arm, had pin placed   Family History  Problem Relation Age of Onset  . Heart disease Mother     Heart problems in her 20s (Pt did not specify)  . Hypertension Father    History  Substance Use Topics  . Smoking status: Former Smoker    Quit date: 12/09/1972  . Smokeless tobacco: Never Used  . Alcohol Use: 1.2 oz/week    2 Cans of beer per week     Comment: Not often    Review of Systems  All other systems reviewed and are negative.   Allergies  Amoxicillin; Asa; Lovaza; Penicillins; Statins;  and Tricor  Home Medications   Prior to Admission medications   Medication Sig Start Date End Date Taking? Authorizing Provider  azithromycin (ZITHROMAX Z-PAK) 250 MG tablet Take 2 tablets today, then 1 tablet daily on days 2 through 5. Take with food 10/06/14   Jacqulyn Cane, MD  Coenzyme Q10 (CO Q 10 PO) Take 1 capsule by mouth daily.    Historical Provider, MD  diazepam (VALIUM) 5 MG tablet Take 5 mg by mouth as needed for anxiety.    Historical Provider, MD  diazepam (VALIUM) 5 MG tablet One tablet qhs prm difficulty sleeping 08/22/14   Fransico Meadow, PA-C  fluticasone [facility]) 50 MCG/ACT nasal spray 1 or 2 sprays each nostril twice a day 10/06/14   Jacqulyn Cane, MD  fosinopril (MONOPRIL) 10 MG tablet Take 1 tablet (10 mg total) by mouth daily. 06/27/14   Lorretta Harp, MD  loratadine (CLARITIN) 10 MG tablet Take 10 mg by mouth daily as needed for allergies.    Historical Provider, MD  metoprolol succinate (TOPROL-XL) 25 MG 24 hr tablet Take 12.5 mg by mouth daily. 04/20/13   Lorretta Harp, MD  metoprolol succinate (TOPROL-XL) 25 MG 24 hr tablet Take 0.5 tablets (12.5 mg total) by mouth daily. 09/28/14   Lorretta Harp, MD  Multiple Vitamin (MULTIVITAMIN) capsule Take 1 capsule by mouth daily.    Historical Provider, MD  omeprazole (PRILOSEC) 40 MG capsule Take 40 mg by mouth daily as needed.     Historical Provider, MD  VITAMIN D, CHOLECALCIFEROL, PO Take 1 capsule by mouth daily.    Historical Provider, MD   BP 137/72  Pulse 55  Temp(Src) 97.7 F (36.5 C) (Oral)  Ht 5\' 9"  (1.753 m)  Wt 170 lb (77.111 kg)  BMI 25.09 kg/m2  SpO2 98% Physical Exam  Nursing note and vitals reviewed. Constitutional: He is oriented to person, place, and time. He appears well-developed and well-nourished. No distress.  HENT:  Head: Normocephalic and atraumatic.  Right Ear: Tympanic membrane, external ear and ear canal normal.  Left Ear: Tympanic membrane, external ear and ear canal normal.   Nose: Mucosal edema and rhinorrhea present. Right sinus exhibits maxillary sinus tenderness. Left sinus exhibits maxillary sinus tenderness.  Mouth/Throat: Oropharynx is clear and moist. No oral lesions. No oropharyngeal exudate.  Nose: Mildly swollen, pale, boggy turbinates  Eyes: Right eye exhibits no discharge. Left eye exhibits no discharge. No scleral icterus.  Neck: Neck supple.  Cardiovascular: Normal rate, regular rhythm and normal heart sounds.   Pulmonary/Chest: Effort normal and breath sounds normal. He has no wheezes. He has no rales.  Lymphadenopathy:  He has no cervical adenopathy.  Neurological: He is alert and oriented to person, place, and time.  Skin: Skin is warm and dry.   no lesions   ED Course  Procedures (including critical care time) Labs Review Labs Reviewed - No data to display  Imaging Review No results found.   MDM   1. Acute maxillary sinusitis, recurrence not specified   2. Allergic rhinitis due to pollen    Treatment options discussed, as well as risks, benefits, alternatives. Patient voiced understanding and agreement with the following plans: Z-Pak Flonase He avoids decongestants because of his history of hypertension. We discussed Claritin. May take one half Claritin daily as long as it doesn't over dry, but avoid taking if it causes too much dryness. Follow-up with your primary care doctor in 5-7 days if not improving, or sooner if symptoms become worse.----He has no current PCP, so we assisted him with making an appointment with a PCP at primary [facility] Precautions discussed. Red flags discussed. Questions invited and answered. Patient voiced understanding and agreement.    Jacqulyn Cane, MD 10/06/14 [phone]

## 2014-10-06 NOTE — ED Notes (Signed)
Sinus pain, pressure, tinnitus, watery eyes, headaches, congestion, slight nausea was treated last week but felt like he was having allergic reaction to medicine and discontinued use.

## 2014-10-17 ENCOUNTER — Ambulatory Visit (INDEPENDENT_AMBULATORY_CARE_PROVIDER_SITE_OTHER): Payer: Medicare Other | Admitting: Family Medicine

## 2014-10-17 ENCOUNTER — Encounter: Payer: Self-pay | Admitting: Family Medicine

## 2014-10-17 VITALS — BP 126/63 | HR 58 | Ht 69.0 in | Wt 179.0 lb

## 2014-10-17 DIAGNOSIS — Z951 Presence of aortocoronary bypass graft: Secondary | ICD-10-CM | POA: Diagnosis not present

## 2014-10-17 DIAGNOSIS — Z8546 Personal history of malignant neoplasm of prostate: Secondary | ICD-10-CM | POA: Diagnosis not present

## 2014-10-17 DIAGNOSIS — E785 Hyperlipidemia, unspecified: Secondary | ICD-10-CM

## 2014-10-17 DIAGNOSIS — L821 Other seborrheic keratosis: Secondary | ICD-10-CM

## 2014-10-17 DIAGNOSIS — G4700 Insomnia, unspecified: Secondary | ICD-10-CM | POA: Diagnosis not present

## 2014-10-17 DIAGNOSIS — J321 Chronic frontal sinusitis: Secondary | ICD-10-CM | POA: Diagnosis not present

## 2014-10-17 DIAGNOSIS — I2510 Atherosclerotic heart disease of native coronary artery without angina pectoris: Secondary | ICD-10-CM

## 2014-10-17 MED ORDER — DIAZEPAM 5 MG PO TABS: ORAL_TABLET | ORAL | Status: DC

## 2014-10-17 MED ORDER — MONTELUKAST SODIUM 10 MG PO TABS: 10.0000 mg | ORAL_TABLET | Freq: Every day | ORAL | Status: DC

## 2014-10-17 NOTE — Progress Notes (Addendum)
CC: Randall Compton is a 80 y.o. male is here for Establish Care   Subjective: HPI:  Very pleasant 80 year old here to establish care: Reports a history of CABG 5 that occurred in the late 1990s. He's no longer able to take aspirin due to a gastrointestinal bleed. He denies any chest pain nor shortness of breath for at least the last year.  Reports a history of prostate cancer found approximately 20 years ago.he estimates that Last PSA was 2012 and it was undetectable. He's never had prostate surgery but did have a radiation seed implant. He denies any genitourinary complaints.  He has a mole on the right flank that his wife pointed out to him yesterday nontender does not bother him other than he is worried it could be cancer. He denies any personal history of skin cancer. He is unsure how long it's been there.  Reports a history of hyperlipidemia: He's been on TriCor and had an intolerance, he also had an intolerance to Lovaza, and also had an intolerance to 5 different types of statins. He is not open to the idea to ever starting on any cholesterol medication. He and his cardiologist have elected to no longer check a cholesterol panel because the patient states the results would not ever influence his diet or consideration of trying any medication for cholesterol.  Tells me he has sinus pressure on almost a daily basis. It is localized to the forehead and nonradiating. It is worst around dusty environments or during certain pollen season. Slightly improves with taking loratadine. Also has improved with nasal steroids. He still reports that symptoms are mild to moderate in severity and interfere with his quality of life. The [place] by nasal congestion and postnasal drip  Reports a history of insomnia that has been present for matter of years. It seems to be present only on nights after a stressful day. If he takes half a dose of Valium he has no difficulty staying asleep or falling asleep on these  nights. He denies side effects and reports using them only 2-3 nights a week. The only thing he can identify that causes him to stay awake is stress and his mind racing. He denies any other mental disturbance  Review of Systems - General ROS: negative for - chills, fever, night sweats, weight gain or weight loss Ophthalmic ROS: negative for - decreased vision Psychological ROS: negative for - anxiety or depression ENT ROS: negative for - hearing change,  tinnitus  Hematological and Lymphatic ROS: negative for - bleeding problems, bruising or swollen lymph nodes Breast ROS: negative Respiratory ROS: no cough, shortness of breath, or wheezing Cardiovascular ROS: no chest pain or dyspnea on exertion Gastrointestinal ROS: no abdominal pain, change in bowel habits, or black or bloody stools Genito-Urinary ROS: negative for - genital discharge, genital ulcers, incontinence or abnormal bleeding from genitals Musculoskeletal ROS: negative for - joint pain or muscle pain Neurological ROS: negative for - headaches or memory loss Dermatological ROS: negative for lumps, mole changes, rash and skin lesion changesother than that described above  Past Medical History  Diagnosis Date  . CAD (coronary artery disease)     s/p bypass grafting 06/01/96 with a LIMA to his LAD, a vein to the diagonal branch, a marginal branch, PDA, as well as distal circumflex. ECHO 07/21/96= Normal LV size, wall thickness & global systolic function, EF 15-40%.  Abnormal septal motion compatible with post-operative state w/o major wall motion abnormalities. Aortic sclerosis w/mild aortic regurgitation. Myoview 03/24/12  was non ischemic.  [name] Hypertension   . Bradycardia 04/07/13    EKG 04/07/13 showed sinus bradycardia at 54 bpm.  . Leg pain   . Ankle pain   . Back pain     Chronic  . Broken arm     Had pin placed  . Bronchitis 02/02/2004    Chest CT = Heart vascularity are normal. There is evidence of a previous CABG with some  tortuosity of the thoracic aorta. There are no infiltrates or effusions, or other acute abnormalities.  . Postoperative anemia 06/01/96  . MI (myocardial infarction)     Prior to CABG 06/01/1996  . BPH (benign prostatic hyperplasia)   . DOE (dyspnea on exertion)   . Aortic sclerosis     With mild aortic regurgitation  . Mild aortic regurgitation     With aortic sclerosis  . Nocturia     Of 0-1  . Lower extremity edema     Late in the evening  . Palpitations     Occasional  . Hyperlipemia     Statin intolerant.  . Elevated cholesterol     Mild, statin intolerant.    Past Surgical History  Procedure Laterality Date  . Coronary artery bypass graft  06/04/96    Dr. Cyndia Bent placed LIMA to the LAD, SVG to the DX, SVG to the OM, & SVG to the PDA - distal CFX.  . Cardiac catheterization  06/01/96    60-70% distal (L) main, totally occluded LAD prior to the take off of the (L) of the diagonal w/thrombus present, multiple 60% lesions in the circumflex, & a totally occluded (R). Had a good collateral support to the distal RCA & distal LAD distribution but did have significant LV dysfunction w/EF of 30% & significant akinesia in the LAD distribution. Renals, distal aorta & iliacs were normal.   . Cholecystectomy    . Other surgical history      Broken arm, had pin placed   Family History  Problem Relation Age of Onset  . Heart disease Mother     Heart problems in her 43s (Pt did not specify)  . Hypertension Father     History   Social History  . Marital Status: Married    Spouse Name: N/A    Number of Children: N/A  . Years of Education: N/A   Occupational History  . Not on file.   Social History Main Topics  . Smoking status: Former Smoker    Quit date: 12/09/1972  . Smokeless tobacco: Never Used  . Alcohol Use: 1.2 oz/week    2 Cans of beer per week     Comment: Not often  . Drug Use: No  . Sexual Activity: Not on file   Other Topics Concern  . Not on file   Social  History Narrative     Objective: BP 126/63 mmHg  Pulse 58  Ht 5\' 9"  (1.753 m)  Wt 179 lb (81.194 kg)  BMI 26.42 kg/m2  General: Alert and Oriented, No Acute Distress HEENT: Pupils equal, round, reactive to light. Conjunctivae clear.  External ears unremarkable, canals clear with intact TMs with appropriate landmarks.  Middle ear appears open without effusion. Pink inferior turbinates.  Moist mucous membranes, pharynx without inflammation nor lesions.  Neck supple without palpable lymphadenopathy nor abnormal masses. Lungs: Clear to auscultation bilaterally, no wheezing/ronchi/rales.  Comfortable work of breathing. Good air movement. Cardiac: Regular rate and rhythm. Normal S1/S2.  No murmurs, rubs, nor gallops.  No  carotid bruits Extremities: No peripheral edema.  Strong peripheral pulses.  Mental Status: No depression, anxiety, nor agitation. Skin: Warm and dry.  noninflamed seborrheic keratoses 3 on the back one of which is the one he brought up in the history of present illness which is on the right flank  Assessment & Plan: Audry was seen today for establish care.  Diagnoses and associated orders for this visit:  History of coronary artery bypass graft  History of prostate cancer  Seborrheic keratoses  Hyperlipidemia  Chronic frontal sinusitis - montelukast (SINGULAIR) 10 MG tablet; Take 1 tablet (10 mg total) by mouth at bedtime. To help with allergies/sinus.  Insomnia - diazepam (VALIUM) 5 MG tablet; One tablet qhs prm difficulty sleeping    CABG with a history of coronary artery disease:controlled, will continue on beta blocker and ACE inhibitor. He is not a candidate for aspirin due to GI bleeding. History of prostate cancer: Controlled, offered PSA to be obtained at his upcoming wellness exam Seborrheic keratoses: Discussed benign nature of these skin lesions and that we can remove them if they're ever painful. Hyperlipidemia: Agree with this choice of no longer  checking lipid panel since he is adamant he would never changed his diet or start cholesterol medication regardless of the results. Chronic frontal sinusitis: Uncontrolled, continue nasal steroid, loratadine and adding Singulair Insomnia: Controlled with as needed use of diazepam   Return in about 2 weeks (around 10/31/2014) for Medicare Wellness Visit.

## 2014-10-31 ENCOUNTER — Ambulatory Visit (INDEPENDENT_AMBULATORY_CARE_PROVIDER_SITE_OTHER): Payer: Medicare Other | Admitting: Family Medicine

## 2014-10-31 ENCOUNTER — Encounter: Payer: Self-pay | Admitting: Family Medicine

## 2014-10-31 VITALS — BP 140/57 | HR 68 | Temp 98.1°F | Ht 69.0 in | Wt 177.0 lb

## 2014-10-31 DIAGNOSIS — J309 Allergic rhinitis, unspecified: Secondary | ICD-10-CM

## 2014-10-31 DIAGNOSIS — Z8546 Personal history of malignant neoplasm of prostate: Secondary | ICD-10-CM

## 2014-10-31 DIAGNOSIS — Z125 Encounter for screening for malignant neoplasm of prostate: Secondary | ICD-10-CM | POA: Diagnosis not present

## 2014-10-31 DIAGNOSIS — C61 Malignant neoplasm of prostate: Secondary | ICD-10-CM | POA: Diagnosis not present

## 2014-10-31 DIAGNOSIS — H9193 Unspecified hearing loss, bilateral: Secondary | ICD-10-CM | POA: Diagnosis not present

## 2014-10-31 DIAGNOSIS — Z0000 Encounter for general adult medical examination without abnormal findings: Secondary | ICD-10-CM

## 2014-10-31 MED ORDER — PREDNISONE 20 MG PO TABS: ORAL_TABLET | ORAL | Status: AC

## 2014-10-31 NOTE — Progress Notes (Signed)
Subjective:    Randall Compton is a 80 y.o. male who presents for Medicare Initial preventive examination.   Preventive Screening-Counseling & Management  Tobacco History  Smoking status  . Former Smoker  . Quit date: 12/09/1972  Smokeless tobacco  . Never Used    Colonoscopy: He believes his last one was Maybe 10 years ago, he is uncertain about prior results from his colonoscopies. He does have old records though that he is agreeable to dropping off in the near future. Prostate: History of prostate cancer, he tells me his last PSA was done a few years ago and was undetectable.  He had seed implants but no prostatectomy, he is open to the idea of PSA today  Influenza Vaccine: received this year Pneumovax: He is competent he's had a few of these in the past, requesting outside records Td/Tdap: He thinks there is a possibility he had this within the last 10 years, requesting outside records Zoster: 2012   Problems Prior to Visit 1. CABG, essential hypertension, hyperlipidemia(he continues to not want cholesterol checked since he is adamant that he would never start a statin or any other cholesterol lowering medication)  His only complaint today is postnasal drip that is moderate in severity occurring on a daily basis for the last few he did not like side effects from Singulair so stopped it since it wasn't helping much anyway. He continues on a nasal steroid spray and over-the-counter antihistamine. Symptoms are worse in the evening when lying down  Current Problems (verified) Patient Active Problem List   Diagnosis Date Noted  . History of coronary artery bypass graft 10/17/2014  . History of prostate cancer 10/17/2014  . Chronic frontal sinusitis 10/17/2014  . Insomnia 10/17/2014  . Coronary artery disease 05/10/2014  . Essential hypertension 05/10/2014  . Hyperlipidemia 05/10/2014    Medications Prior to Visit Current Outpatient Prescriptions on File Prior to Visit   Medication Sig Dispense Refill  . Coenzyme Q10 (CO Q 10 PO) Take 1 capsule by mouth daily.    . diazepam (VALIUM) 5 MG tablet One tablet qhs prm difficulty sleeping 30 tablet 1  . fluticasone (FLONASE) 50 MCG/ACT nasal spray 1 or 2 sprays each nostril twice a day 16 g 1  . fosinopril (MONOPRIL) 10 MG tablet Take 1 tablet (10 mg total) by mouth daily. 90 tablet 2  . loratadine (CLARITIN) 10 MG tablet Take 10 mg by mouth daily as needed for allergies.    . metoprolol succinate (TOPROL-XL) 25 MG 24 hr tablet Take 0.5 tablets (12.5 mg total) by mouth daily. 45 tablet 3  . Multiple Vitamin (MULTIVITAMIN) capsule Take 1 capsule by mouth daily.    [name] omeprazole (PRILOSEC) 40 MG capsule Take 40 mg by mouth daily as needed.     [name] VITAMIN D, CHOLECALCIFEROL, PO Take 1 capsule by mouth daily.     No current facility-administered medications on file prior to visit.    Current Medications (verified) Current Outpatient Prescriptions  Medication Sig Dispense Refill  . Coenzyme Q10 (CO Q 10 PO) Take 1 capsule by mouth daily.    . diazepam (VALIUM) 5 MG tablet One tablet qhs prm difficulty sleeping 30 tablet 1  . fluticasone (FLONASE) 50 MCG/ACT nasal spray 1 or 2 sprays each nostril twice a day 16 g 1  . fosinopril (MONOPRIL) 10 MG tablet Take 1 tablet (10 mg total) by mouth daily. 90 tablet 2  . loratadine (CLARITIN) 10 MG tablet Take 10 mg by mouth daily  as needed for allergies.    . metoprolol succinate (TOPROL-XL) 25 MG 24 hr tablet Take 0.5 tablets (12.5 mg total) by mouth daily. 45 tablet 3  . Multiple Vitamin (MULTIVITAMIN) capsule Take 1 capsule by mouth daily.    [name] omeprazole (PRILOSEC) 40 MG capsule Take 40 mg by mouth daily as needed.     [name] VITAMIN D, CHOLECALCIFEROL, PO Take 1 capsule by mouth daily.     No current facility-administered medications for this visit.     Allergies (verified) Amoxicillin; Asa; Lovaza; Penicillins; Statins; and Tricor   PAST HISTORY  Family History Family  History  Problem Relation Age of Onset  . Heart disease Mother     Heart problems in her 49s (Pt did not specify)  . Hypertension Father     Social History History  Substance Use Topics  . Smoking status: Former Smoker    Quit date: 12/09/1972  . Smokeless tobacco: Never Used  . Alcohol Use: 1.2 oz/week    2 Cans of beer per week     Comment: Not often    Are there smokers in your home (other than you)?  No  Risk Factors Current exercise habits: The patient does not participate in regular exercise at present.  Dietary issues discussed: DASH diet   Cardiac risk factors: advanced age (older than 51 for men, 52 for women), dyslipidemia, hypertension and sedentary lifestyle.  Depression Screen (Note: if answer to either of the following is "Yes", a more complete depression screening is indicated)   Q1: Over the past two weeks, have you felt down, depressed or hopeless? No  Q2: Over the past two weeks, have you felt little interest or pleasure in doing things? Yes  Have you lost interest or pleasure in daily life? No  Do you often feel hopeless? No  Do you cry easily over simple problems? No  Activities of Daily Living In your present state of health, do you have any difficulty performing the following activities?:  Driving? No Managing money?  No Feeding yourself? No Getting from bed to chair? No Climbing a flight of stairs? No Preparing food and eating?: No Bathing or showering? No Getting dressed: No Getting to the toilet? No Using the toilet:No Moving around from place to place: No In the past year have you fallen or had a near fall?:No   Are you sexually active?  No  Do you have more than one partner?  No  Hearing Difficulties: Yes Do you often ask people to speak up or repeat themselves? Yes Do you experience ringing or noises in your ears? Yes Do you have difficulty understanding soft or whispered voices? Yes   Do you feel that you have a problem with  memory? No  Do you often misplace items? No  Do you feel safe at home?  No  Cognitive Testing  Alert? Yes  Normal Appearance?Yes  Oriented to person? Yes  Place? Yes   Time? Yes  Recall of three objects?  Yes  Can perform simple calculations? Yes  Displays appropriate judgment?Yes  Can read the correct time from a watch face?Yes   Advanced Directives have been discussed with the patient? Yes   List the Names of Other Physician/Practitioners you currently use: 1.    Indicate any recent Medical Services you may have received from other than [facility] providers in the past year (date may be approximate).  Immunization History  Administered Date(s) Administered  . Influenza-Unspecified 09/08/2014  . Zoster 12/09/2010  Screening Tests Health Maintenance  Topic Date Due  . TETANUS/TDAP  12/10/1952  . COLONOSCOPY  12/11/1983  . PNEUMOCOCCAL POLYSACCHARIDE VACCINE AGE 61 AND OVER  12/10/1998  . INFLUENZA VACCINE  07/10/2015  . ZOSTAVAX  Completed    All answers were reviewed with the patient and necessary referrals were made:  Marcial Pacas, DO   10/31/2014   History reviewed: allergies, current medications, past family history, past medical history, past social history, past surgical history and problem list  Review of Systems  Review of Systems - General ROS: negative for - chills, fever, night sweats, weight gain or weight loss Ophthalmic ROS: negative for - decreased vision Psychological ROS: negative for - anxiety or depression ENT ROS: negative for - dysphagia Hematological and Lymphatic ROS: negative for - bleeding problems, bruising or swollen lymph nodes Breast ROS: negative Respiratory ROS: no cough, shortness of breath, or wheezing Cardiovascular ROS: no chest pain or dyspnea on exertion Gastrointestinal ROS: no abdominal pain, change in bowel habits, or black or bloody stools Genito-Urinary ROS: negative for - genital discharge, genital ulcers, incontinence or  abnormal bleeding from genitals Musculoskeletal ROS: negative for - joint pain or muscle pain Neurological ROS: negative for - headaches or memory loss Dermatological ROS: negative for lumps, mole changes, rash and skin lesion changes  Objective:     Vision by Snellen chart: right eye:20/40, left eye:20/40 Blood pressure 140/57, pulse 68, temperature 98.1 F (36.7 C), temperature source Oral, height 5\' 9"  (1.753 m), weight 177 lb (80.287 kg). Body mass index is 26.13 kg/(m^2).  General: No Acute Distress HEENT: Atraumatic, normocephalic, conjunctivae normal without scleral icterus.  No nasal discharge, hearing grossly intact, TMs with good landmarks bilaterally with no middle ear abnormalities, posterior pharynx clear without oral lesions. Neck: Supple, trachea midline, no cervical nor supraclavicular adenopathy. Pulmonary: Clear to auscultation bilaterally without wheezing, rhonchi, nor rales. Cardiac: Regular rate and rhythm.  No murmurs, rubs, nor gallops. No peripheral edema.  2+ peripheral pulses bilaterally. Abdomen: Bowel sounds normal.  No masses.  Non-tender without rebound.  Negative Murphy's sign. GU: Bilateral descended testes without inguinal hernia  MSK: Grossly intact, no signs of weakness.  Full strength throughout upper and lower extremities.  Full ROM in upper and lower extremities.  No midline spinal tenderness. Neuro: Gait unremarkable, CN II-XII grossly intact.  C5-C6 Reflex 2/4 Bilaterally, L4 Reflex 2/4 Bilaterally.  Cerebellar function intact. Skin: No rashes. Psych: Alert and oriented to person/place/time.  Thought process normal. No anxiety/depression.     Assessment:     Postnasal drip due to allergic sinusitis. Hearing loss.     Plan:     During the course of the visit the patient was educated and counseled about appropriate screening and preventive services including:    Prostate cancer screening  Diabetes screening   Outside records for a  colonoscopy and immunization history has been requested, determination of whether or not he needs a repeat colonoscopy will be determined on outside records.  Referral to audiology has been placed  Diet review for nutrition referral? Not indicated Patient Instructions (the written plan) was given to the patient.  Medicare Attestation I have personally reviewed: The patient's medical and social history Their use of alcohol, tobacco or illicit drugs Their current medications and supplements The patient's functional ability including ADLs,fall risks, home safety risks, cognitive, and hearing and visual impairment Diet and physical activities Evidence for depression or mood disorders  The patient's weight, height, BMI, and visual acuity have been recorded  in the chart.  I have made referrals, counseling, and provided education to the patient based on review of the above and I have provided the patient with a written personalized care plan for preventive services.     Marcial Pacas, DO   10/31/2014

## 2014-10-31 NOTE — Patient Instructions (Addendum)
Call me next week if not contacted by an audiologist regarding your hearing loss.  Dr. Lajoyce Lauber General Advice Following Your Complete Physical Exam  The Benefits of Regular Exercise: Unless you suffer from an uncontrolled cardiovascular condition, studies strongly suggest that regular exercise and physical activity will add to both the quality and length of your life.  The World Health Organization recommends 150 minutes of moderate intensity aerobic activity every week.  This is best split over 3-4 days a week, and can be as simple as a brisk walk for just over 35 minutes "most days of the week".  This type of exercise has been shown to lower LDL-Cholesterol, lower average blood sugars, lower blood pressure, lower cardiovascular disease risk, improve memory, and increase one's overall sense of wellbeing.  The addition of anaerobic (or "strength training") exercises offers additional benefits including but not limited to increased metabolism, prevention of osteoporosis, and improved overall cholesterol levels.  How Can I Strive For A Low-Fat Diet?: Current guidelines recommend that 25-35 percent of your daily energy (food) intake should come from fats.  One might ask how can this be achieved without having to dissect each meal on a daily basis?  Switch to skim or 1% milk instead of whole milk.  Focus on lean meats such as ground [place], fresh fish, baked chicken, and lean cuts of beef as your source of dietary protein.  Limit saturated fat consumption to less than 10% of your daily caloric intake.  Limit trans fatty acid consumption primarily by limiting synthetic trans fats such as partially hydrogenated oils (Ex: fried fast foods).  Substitute olive or vegetable oil for solid fats where possible.  Moderation of Salt Intake: Provided you don't carry a diagnosis of congestive heart failure nor renal failure, I recommend a daily allowance of no more than 2300 mg of salt (sodium).  Keeping under  this daily goal is associated with a decreased risk of cardiovascular events, creeping above it can lead to elevated blood pressures and increases your risk of cardiovascular events.  Milligrams (mg) of salt is listed on all nutrition labels, and your daily intake can add up faster than you think.  Most canned and frozen dinners can pack in over half your daily salt allowance in one meal.    Lifestyle Health Risks: Certain lifestyle choices carry specific health risks.  As you may already know, tobacco use has been associated with increasing one's risk of cardiovascular disease, pulmonary disease, numerous cancers, among many other issues.  What you may not know is that there are medications and nicotine replacement strategies that can more than double your chances of successfully quitting.  I would be thrilled to help manage your quitting strategy if you currently use tobacco products.  When it comes to alcohol use, I've yet to find an "ideal" daily allowance.  Provided an individual does not have a medical condition that is exacerbated by alcohol consumption, general guidelines determine "safe drinking" as no more than two standard drinks for a man or no more than one standard drink for a male per day.  However, much debate still exists on whether any amount of alcohol consumption is technically "safe".  My general advice, keep alcohol consumption to a minimum for general health promotion.  If you or others believe that alcohol, tobacco, or recreational drug use is interfering with your life, I would be happy to provide confidential counseling regarding treatment options.  General "Over The Counter" Nutrition Advice: Postmenopausal women should aim for a  daily calcium intake of 1200 mg, however a significant portion of this might already be provided by diets including milk, yogurt, cheese, and other dairy products.  Vitamin D has been shown to help preserve bone density, prevent fatigue, and has even been  shown to help reduce falls in the elderly.  Ensuring a daily intake of 800 Units of Vitamin D is a good place to start to enjoy the above benefits, we can easily check your Vitamin D level to see if you'd potentially benefit from supplementation beyond 800 Units a day.  Folic Acid intake should be of particular concern to women of childbearing age.  Daily consumption of 340-370 mcg of Folic Acid is recommended to minimize the chance of spinal cord defects in a fetus should pregnancy occur.    For many adults, accidents still remain one of the most common culprits when it comes to cause of death.  Some of the simplest but most effective preventitive habits you can adopt include regular seatbelt use, proper helmet use, securing firearms, and regularly testing your smoke and carbon monoxide detectors.  Nalu Troublefield [address], [place], [place] [address] Phone: [phone]

## 2014-11-01 LAB — COMPLETE METABOLIC PANEL WITH GFR
ALBUMIN: 4.7 g/dL (ref 3.5–5.2)
ALK PHOS: 43 U/L (ref 39–117)
ALT: 12 U/L (ref 0–53)
AST: 17 U/L (ref 0–37)
BUN: 11 mg/dL (ref 6–23)
CALCIUM: 9.3 mg/dL (ref 8.4–10.5)
CHLORIDE: 104 meq/L (ref 96–112)
CO2: 27 mEq/L (ref 19–32)
Creat: 0.98 mg/dL (ref 0.50–1.35)
GFR, Est African American: 84 mL/min
GFR, Est Non African American: 73 mL/min
Glucose, Bld: 94 mg/dL (ref 70–99)
Potassium: 4.2 mEq/L (ref 3.5–5.3)
SODIUM: 140 meq/L (ref 135–145)
Total Bilirubin: 1.2 mg/dL (ref 0.2–1.2)
Total Protein: 6.8 g/dL (ref 6.0–8.3)

## 2014-11-01 LAB — PSA: PSA: <0.01 ng/mL (ref <=4.00)

## 2014-11-25 ENCOUNTER — Emergency Department (INDEPENDENT_AMBULATORY_CARE_PROVIDER_SITE_OTHER)
Admission: EM | Admit: 2014-11-25 | Discharge: 2014-11-25 | Disposition: A | Discharge status: Home / Self Care | Payer: Medicare Other | Source: Home / Self Care | Attending: Emergency Medicine | Admitting: Emergency Medicine

## 2014-11-25 ENCOUNTER — Encounter: Payer: Self-pay | Admitting: *Deleted

## 2014-11-25 DIAGNOSIS — J209 Acute bronchitis, unspecified: Secondary | ICD-10-CM

## 2014-11-25 MED ORDER — AZITHROMYCIN 250 MG PO TABS: ORAL_TABLET | ORAL | Status: DC

## 2014-11-25 MED ORDER — GUAIFENESIN-CODEINE 100-10 MG/5ML PO SYRP: ORAL_SOLUTION | ORAL | Status: DC

## 2014-11-25 NOTE — ED Notes (Signed)
Randall Compton c/o cough, congestion, HA, possible low grade fever. Rec'd flu vac this season.

## 2014-11-28 NOTE — ED Provider Notes (Signed)
CSN: [id number]     Arrival date & time 11/25/14  1136 History   First MD Initiated Contact with Patient 11/25/14 1154     Chief Complaint  Patient presents with  . URI   (Consider location/radiation/quality/duration/timing/severity/associated sxs/prior Treatment) HPI URI HISTORY  Randall Compton is a 80 y.o. male who complains of onset of cold symptoms for several days. Cough, congestion, mild headache, low-grade fever. Have been using over-the-counter treatment which helps a little bit.  No chills/sweats +  Fever  +  Nasal congestion +  Discolored Post-nasal drainage No sinus pain/pressure No sore throat  +  cough No wheezing Positive chest congestion No hemoptysis No shortness of breath No pleuritic pain  No itchy/red eyes No earache  No nausea No vomiting No abdominal pain No diarrhea  No skin rashes +  Fatigue No myalgias No headache    Past Medical History  Diagnosis Date  . CAD (coronary artery disease)     s/p bypass grafting 06/01/96 with a LIMA to his LAD, a vein to the diagonal branch, a marginal branch, PDA, as well as distal circumflex. ECHO 07/21/96= Normal LV size, wall thickness & global systolic function, EF 16-38%.  Abnormal septal motion compatible with post-operative state w/o major wall motion abnormalities. Aortic sclerosis w/mild aortic regurgitation. Myoview 03/24/12 was non ischemic.  [name] Hypertension   . Bradycardia 04/07/13    EKG 04/07/13 showed sinus bradycardia at 54 bpm.  . Leg pain   . Ankle pain   . Back pain     Chronic  . Broken arm     Had pin placed  . Bronchitis 02/02/2004    Chest CT = Heart vascularity are normal. There is evidence of a previous CABG with some tortuosity of the thoracic aorta. There are no infiltrates or effusions, or other acute abnormalities.  . Postoperative anemia 06/01/96  . MI (myocardial infarction)     Prior to CABG 06/01/1996  . BPH (benign prostatic hyperplasia)   . DOE (dyspnea on exertion)   . Aortic  sclerosis     With mild aortic regurgitation  . Mild aortic regurgitation     With aortic sclerosis  . Nocturia     Of 0-1  . Lower extremity edema     Late in the evening  . Palpitations     Occasional  . Hyperlipemia     Statin intolerant.  . Elevated cholesterol     Mild, statin intolerant.   Past Surgical History  Procedure Laterality Date  . Coronary artery bypass graft  06/04/96    Dr. Cyndia Bent placed LIMA to the LAD, SVG to the DX, SVG to the OM, & SVG to the PDA - distal CFX.  . Cardiac catheterization  06/01/96    60-70% distal (L) main, totally occluded LAD prior to the take off of the (L) of the diagonal w/thrombus present, multiple 60% lesions in the circumflex, & a totally occluded (R). Had a good collateral support to the distal RCA & distal LAD distribution but did have significant LV dysfunction w/EF of 30% & significant akinesia in the LAD distribution. Renals, distal aorta & iliacs were normal.   . Cholecystectomy    . Other surgical history      Broken arm, had pin placed   Family History  Problem Relation Age of Onset  . Heart disease Mother     Heart problems in her 60s (Pt did not specify)  . Hypertension Father    History  Substance Use Topics  .  Smoking status: Former Smoker    Quit date: 12/09/1972  . Smokeless tobacco: Never Used  . Alcohol Use: 1.2 oz/week    2 Cans of beer per week     Comment: Not often    Review of Systems  All other systems reviewed and are negative.   Allergies  Amoxicillin; Asa; Lovaza; Penicillins; Singulair; Statins; and Tricor  Home Medications   Prior to Admission medications   Medication Sig Start Date End Date Taking? Authorizing Provider  azithromycin (ZITHROMAX Z-PAK) 250 MG tablet Take 2 tablets on day one, then 1 tablet daily on days 2 through 5 11/25/14   Jacqulyn Cane, MD  Coenzyme Q10 (CO Q 10 PO) Take 1 capsule by mouth daily.    Historical Provider, MD  diazepam (VALIUM) 5 MG tablet One tablet qhs prm  difficulty sleeping 10/17/14   Marcial Pacas, DO  fluticasone (FLONASE) 50 MCG/ACT nasal spray 1 or 2 sprays each nostril twice a day 10/06/14   Jacqulyn Cane, MD  fosinopril (MONOPRIL) 10 MG tablet Take 1 tablet (10 mg total) by mouth daily. 06/27/14   Lorretta Harp, MD  guaiFENesin-codeine [facility]) 100-10 MG/5ML syrup Take 1 teaspoon at bedtime as needed for cough 11/25/14   Jacqulyn Cane, MD  loratadine (CLARITIN) 10 MG tablet Take 10 mg by mouth daily as needed for allergies.    Historical Provider, MD  metoprolol succinate (TOPROL-XL) 25 MG 24 hr tablet Take 0.5 tablets (12.5 mg total) by mouth daily. 09/28/14   Lorretta Harp, MD  Multiple Vitamin (MULTIVITAMIN) capsule Take 1 capsule by mouth daily.    Historical Provider, MD  omeprazole (PRILOSEC) 40 MG capsule Take 40 mg by mouth daily as needed.     Historical Provider, MD  VITAMIN D, CHOLECALCIFEROL, PO Take 1 capsule by mouth daily.    Historical Provider, MD   BP 113/69 mmHg  Pulse 80  Temp(Src) 98.5 F (36.9 C) (Oral)  Resp 14  Wt 176 lb (79.833 kg)  SpO2 97% Physical Exam  Constitutional: He is oriented to person, place, and time. He appears well-developed and well-nourished. No distress.  HENT:  Head: Normocephalic and atraumatic.  Right Ear: Tympanic membrane normal.  Left Ear: Tympanic membrane normal.  Nose: Nose normal.  Mouth/Throat: Oropharynx is clear and moist. No oropharyngeal exudate.  Eyes: Right eye exhibits no discharge. Left eye exhibits no discharge. No scleral icterus.  Neck: Neck supple.  Cardiovascular: Normal rate, regular rhythm and normal heart sounds.   Pulmonary/Chest: No respiratory distress. He has no wheezes. He has rhonchi. He has no rales.  Lymphadenopathy:    He has no cervical adenopathy.  Neurological: He is alert and oriented to person, place, and time.  Skin: Skin is warm and dry.  Nursing note and vitals reviewed.   ED Course  Procedures (including critical care time) Labs  Review Labs Reviewed - No data to display  Imaging Review No results found.   MDM   1. Acute bronchitis, unspecified organism    Treatment options discussed, as well as risks, benefits, alternatives. Patient voiced understanding and agreement with the following plans: Discharge Medication List as of 11/25/2014 12:27 PM    START taking these medications   Details  azithromycin (ZITHROMAX Z-PAK) 250 MG tablet Take 2 tablets on day one, then 1 tablet daily on days 2 through 5, Print    guaiFENesin-codeine (CHERATUSSIN AC) 100-10 MG/5ML syrup Take 1 teaspoon at bedtime as needed for cough, Print  Other symptomatic care discussed Follow-up with your primary care doctor in 5-7 days if not improving, or sooner if symptoms become worse. Precautions discussed. Red flags discussed. Questions invited and answered. Patient voiced understanding and agreement.     Jacqulyn Cane, MD 11/28/14 [phone]

## 2014-12-05 ENCOUNTER — Emergency Department (INDEPENDENT_AMBULATORY_CARE_PROVIDER_SITE_OTHER): Payer: Medicare Other

## 2014-12-05 ENCOUNTER — Encounter: Payer: Self-pay | Admitting: *Deleted

## 2014-12-05 ENCOUNTER — Emergency Department (INDEPENDENT_AMBULATORY_CARE_PROVIDER_SITE_OTHER)
Admission: EM | Admit: 2014-12-05 | Discharge: 2014-12-05 | Disposition: A | Discharge status: Home / Self Care | Payer: Medicare Other | Source: Home / Self Care | Attending: Emergency Medicine | Admitting: Emergency Medicine

## 2014-12-05 DIAGNOSIS — R05 Cough: Secondary | ICD-10-CM

## 2014-12-05 DIAGNOSIS — R059 Cough, unspecified: Secondary | ICD-10-CM

## 2014-12-05 DIAGNOSIS — L300 Nummular dermatitis: Secondary | ICD-10-CM

## 2014-12-05 DIAGNOSIS — R0989 Other specified symptoms and signs involving the circulatory and respiratory systems: Secondary | ICD-10-CM

## 2014-12-05 MED ORDER — BENZONATATE 200 MG PO CAPS: ORAL_CAPSULE | ORAL | Status: DC

## 2014-12-05 MED ORDER — METHYLPREDNISOLONE 4 MG PO KIT: PACK | ORAL | Status: DC

## 2014-12-05 MED ORDER — BETAMETHASONE DIPROPIONATE 0.05 % EX CREA: TOPICAL_CREAM | Freq: Two times a day (BID) | CUTANEOUS | Status: DC

## 2014-12-05 NOTE — ED Provider Notes (Signed)
CSN: [id number]     Arrival date & time 12/05/14  1350 History   None    Chief Complaint  Patient presents with  . Nasal Congestion  . Cough   (Consider location/radiation/quality/duration/timing/severity/associated sxs/prior Treatment) HPI Pt c/o mostly nonproductive cough, nasal congestion, post nasal drip, and chest congestion x 6 wks. He has taken Zpak x 2 with some relief. He finished Zpak 5 days ago.  Has not had fever 5 days. Denies any change in his baseline dyspnea on exertion. Denies exertional chest pain.  He also complains of pruritic skin rash right forearm 6 weeks. Topical Polysporin not helping.  No chills/sweats No Fever  +  Nasal congestion No Discolored Post-nasal drainage No sinus pain/pressure No sore throat  +  Cough, nonproductive No wheezing Positive chest congestion No hemoptysis No pleuritic pain  No itchy/red eyes No earache  No nausea No vomiting No abdominal pain No diarrhea   +  Fatigue No myalgias No headache   Past Medical History  Diagnosis Date  . CAD (coronary artery disease)     s/p bypass grafting 06/01/96 with a LIMA to his LAD, a vein to the diagonal branch, a marginal branch, PDA, as well as distal circumflex. ECHO 07/21/96= Normal LV size, wall thickness & global systolic function, EF 26-83%.  Abnormal septal motion compatible with post-operative state w/o major wall motion abnormalities. Aortic sclerosis w/mild aortic regurgitation. Myoview 03/24/12 was non ischemic.  [name] Hypertension   . Bradycardia 04/07/13    EKG 04/07/13 showed sinus bradycardia at 54 bpm.  . Leg pain   . Ankle pain   . Back pain     Chronic  . Broken arm     Had pin placed  . Bronchitis 02/02/2004    Chest CT = Heart vascularity are normal. There is evidence of a previous CABG with some tortuosity of the thoracic aorta. There are no infiltrates or effusions, or other acute abnormalities.  . Postoperative anemia 06/01/96  . MI (myocardial infarction)      Prior to CABG 06/01/1996  . BPH (benign prostatic hyperplasia)   . DOE (dyspnea on exertion)   . Aortic sclerosis     With mild aortic regurgitation  . Mild aortic regurgitation     With aortic sclerosis  . Nocturia     Of 0-1  . Lower extremity edema     Late in the evening  . Palpitations     Occasional  . Hyperlipemia     Statin intolerant.  . Elevated cholesterol     Mild, statin intolerant.   Past Surgical History  Procedure Laterality Date  . Coronary artery bypass graft  06/04/96    Dr. Cyndia Bent placed LIMA to the LAD, SVG to the DX, SVG to the OM, & SVG to the PDA - distal CFX.  . Cardiac catheterization  06/01/96    60-70% distal (L) main, totally occluded LAD prior to the take off of the (L) of the diagonal w/thrombus present, multiple 60% lesions in the circumflex, & a totally occluded (R). Had a good collateral support to the distal RCA & distal LAD distribution but did have significant LV dysfunction w/EF of 30% & significant akinesia in the LAD distribution. Renals, distal aorta & iliacs were normal.   . Cholecystectomy    . Other surgical history      Broken arm, had pin placed   Family History  Problem Relation Age of Onset  . Heart disease Mother     Heart  problems in her 27s (Pt did not specify)  . Hypertension Father    History  Substance Use Topics  . Smoking status: Former Smoker    Quit date: 12/09/1972  . Smokeless tobacco: Never Used  . Alcohol Use: 1.2 oz/week    2 Cans of beer per week     Comment: Not often    Review of Systems  All other systems reviewed and are negative.   Allergies  Amoxicillin; Asa; Lovaza; Penicillins; Singulair; Statins; and Tricor  Home Medications   Prior to Admission medications   Medication Sig Start Date End Date Taking? Authorizing Provider  benzonatate (TESSALON) 200 MG capsule Take 1 every 8 hours as needed for cough. 12/05/14   Jacqulyn Cane, MD  betamethasone dipropionate (DIPROLENE) 0.05 % cream Apply  topically 2 (two) times daily. 12/05/14   Jacqulyn Cane, MD  Coenzyme Q10 (CO Q 10 PO) Take 1 capsule by mouth daily.    Historical Provider, MD  diazepam (VALIUM) 5 MG tablet One tablet qhs prm difficulty sleeping 10/17/14   Marcial Pacas, DO  fluticasone (FLONASE) 50 MCG/ACT nasal spray 1 or 2 sprays each nostril twice a day 10/06/14   Jacqulyn Cane, MD  fosinopril (MONOPRIL) 10 MG tablet Take 1 tablet (10 mg total) by mouth daily. 06/27/14   Lorretta Harp, MD  guaiFENesin-codeine [facility]) 100-10 MG/5ML syrup Take 1 teaspoon at bedtime as needed for cough 11/25/14   Jacqulyn Cane, MD  loratadine (CLARITIN) 10 MG tablet Take 10 mg by mouth daily as needed for allergies.    Historical Provider, MD  methylPREDNISolone (MEDROL DOSEPAK) 4 MG tablet Take as directed for 6 days 12/05/14   Jacqulyn Cane, MD  metoprolol succinate (TOPROL-XL) 25 MG 24 hr tablet Take 0.5 tablets (12.5 mg total) by mouth daily. 09/28/14   Lorretta Harp, MD  Multiple Vitamin (MULTIVITAMIN) capsule Take 1 capsule by mouth daily.    Historical Provider, MD  omeprazole (PRILOSEC) 40 MG capsule Take 40 mg by mouth daily as needed.     Historical Provider, MD  VITAMIN D, CHOLECALCIFEROL, PO Take 1 capsule by mouth daily.    Historical Provider, MD   BP 149/81 mmHg  Pulse 61  Temp(Src) 98.2 F (36.8 C) (Oral)  Resp 18  Ht 5\' 9"  (1.753 m)  Wt 178 lb (80.74 kg)  BMI 26.27 kg/m2  SpO2 98% Physical Exam  Constitutional: He is oriented to person, place, and time. He appears well-developed and well-nourished. No distress.  HENT:  Head: Normocephalic and atraumatic.  Right Ear: Tympanic membrane normal.  Left Ear: Tympanic membrane normal.  Nose: Nose normal.  Mouth/Throat: Oropharynx is clear and moist. No oropharyngeal exudate.  Eyes: Right eye exhibits no discharge. Left eye exhibits no discharge. No scleral icterus.  Neck: Neck supple.  Cardiovascular: Normal rate, regular rhythm and normal heart sounds.    Pulmonary/Chest: No respiratory distress. He has no wheezes. He has rhonchi (A few anterior rhonchi which clear after coughing). He has no rales.  Lymphadenopathy:    He has no cervical adenopathy.  Neurological: He is alert and oriented to person, place, and time.  Skin: Skin is warm and dry.  Nursing note and vitals reviewed.  Skin: Patch of confluent papulosquamous eruption 1 x 1 cm right forearm.--It's consistent with eczema. It does not have a clear center. ED Course  Procedures (including critical care time) Labs Review Labs Reviewed - No data to display  Imaging Review Dg Chest 2 View  12/05/2014  CLINICAL DATA:  Cough and chest congestion for 6 weeks  EXAM: CHEST  2 VIEW  COMPARISON:  February 02, 2004  FINDINGS: Lungs are clear. Heart size and pulmonary vascularity are normal. No adenopathy. Patient is status post coronary artery bypass grafting. There is degenerative change in the thoracic spine.  IMPRESSION: No edema or consolidation.   Electronically Signed   By: Lowella Grip M.D.   On: 12/05/2014 14:32     MDM   1. Cough   2. Nummular eczema    Most likely has a post bronchitic cough. No evidence of infection at this time.  Chest x-ray negative for acute abnormality. No evidence of infiltrate or edema or consolidation or CHF.  Treatment options discussed, as well as risks, benefits, alternatives. Patient voiced understanding and agreement with the following plans: New Prescriptions   BENZONATATE (TESSALON) 200 MG CAPSULE    Take 1 every 8 hours as needed for cough.   BETAMETHASONE DIPROPIONATE (DIPROLENE) 0.05 % CREAM    Apply topically 2 (two) times daily.   METHYLPREDNISOLONE (MEDROL DOSEPAK) 4 MG TABLET    Take as directed for 6 days   Discussed removing various allergens in the house, such as wet residue from their dogs who've played outside in the rain and mud. Follow-up with your primary care doctor in 5-7 days if not improving, or sooner if symptoms  become worse. Precautions discussed. Red flags discussed. Questions invited and answered. Patient and wife voiced understanding and agreement.     Jacqulyn Cane, MD 12/05/14 [phone]

## 2014-12-05 NOTE — ED Notes (Signed)
Pt c/o mostly nonproductive cough, nasal congestion, post nasal drip, and chest congestion x 6 wks. He has taken Zpak x 2 with minimal relief. He finished Zpak 5 days ago.

## 2015-01-31 ENCOUNTER — Encounter: Payer: Self-pay | Admitting: Family Medicine

## 2015-01-31 ENCOUNTER — Ambulatory Visit (INDEPENDENT_AMBULATORY_CARE_PROVIDER_SITE_OTHER): Payer: Medicare Other | Admitting: Family Medicine

## 2015-01-31 VITALS — BP 153/64 | HR 60 | Temp 97.7°F | Wt 179.0 lb

## 2015-01-31 DIAGNOSIS — H6122 Impacted cerumen, left ear: Secondary | ICD-10-CM | POA: Diagnosis not present

## 2015-01-31 DIAGNOSIS — I10 Essential (primary) hypertension: Secondary | ICD-10-CM | POA: Diagnosis not present

## 2015-01-31 DIAGNOSIS — J321 Chronic frontal sinusitis: Secondary | ICD-10-CM | POA: Diagnosis not present

## 2015-01-31 MED ORDER — FLUTICASONE PROPIONATE 50 MCG/ACT NA SUSP: NASAL | Status: DC

## 2015-01-31 MED ORDER — METOPROLOL SUCCINATE ER 25 MG PO TB24: 25.0000 mg | ORAL_TABLET | Freq: Every day | ORAL | Status: DC

## 2015-01-31 NOTE — Progress Notes (Signed)
CC: Randall Compton is a 81 y.o. male is here for Sinusitis   Subjective: HPI:  Essential hypertension: Continues on fosinopril 10 mg and 12.5 mg of metoprolol on a daily basis. He's been checking his blood pressure at home and notes that it is not uncommon for him to see a systolic of 350. He climbs a flight of stairs 10 times a day and denies any shortness of breath or exertional chest pain. Denies any chest discomfort at rest. No   orthopnea peripheral edema nor motor or sensory disturbances other than that described below regarding the ear  Complains of continued pressure in the forehead and nasal congestion. Symptoms fluctuate throughout the week from a mild to moderate degree and severity. Seems to be worse when he is outside working on his property. Little to no benefit from Claritin. He gets benefit from fluticasone however he forgets to take this on a daily basis using it only about 2 times a week. He tells me that the biggest hurdle with this is that he forgets to take it every night. Denies cough, fevers, chills or shortness of breath.  He complains of buzzing sensation in his left ear that has been present for the last few months. Seems to come and go occasionally will go away completely or be replaced by a ringing in the ear. No interventions as of yet is mild in severity. 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 He denies any drainage or discharge from the ear. There's been no dizziness  Review Of Systems Outlined In HPI  Past Medical History  Diagnosis Date  . CAD (coronary artery disease)     s/p bypass grafting 06/01/96 with a LIMA to his LAD, a vein to the diagonal branch, a marginal branch, PDA, as well as distal circumflex. ECHO 07/21/96= Normal LV size, wall thickness & global systolic function, EF 09-38%.  Abnormal septal motion compatible with post-operative state w/o major wall motion abnormalities. Aortic sclerosis w/mild  aortic regurgitation. Myoview 03/24/12 was non ischemic.  [name] Hypertension   . Bradycardia 04/07/13    EKG 04/07/13 showed sinus bradycardia at 54 bpm.  . Leg pain   . Ankle pain   . Back pain     Chronic  . Broken arm     Had pin placed  . Bronchitis 02/02/2004    Chest CT = Heart vascularity are normal. There is evidence of a previous CABG with some tortuosity of the thoracic aorta. There are no infiltrates or effusions, or other acute abnormalities.  . Postoperative anemia 06/01/96  . MI (myocardial infarction)     Prior to CABG 06/01/1996  . BPH (benign prostatic hyperplasia)   . DOE (dyspnea on exertion)   . Aortic sclerosis     With mild aortic regurgitation  . Mild aortic regurgitation     With aortic sclerosis  . Nocturia     Of 0-1  . Lower extremity edema     Late in the evening  . Palpitations     Occasional  . Hyperlipemia     Statin intolerant.  . Elevated cholesterol     Mild, statin intolerant.    Past Surgical History  Procedure Laterality Date  . Coronary artery bypass graft  06/04/96    Dr. Cyndia Bent placed LIMA to the LAD, SVG to the DX, SVG to the OM, & SVG to the PDA - distal CFX.  . Cardiac catheterization  06/01/96    60-70%  distal (L) main, totally occluded LAD prior to the take off of the (L) of the diagonal w/thrombus present, multiple 60% lesions in the circumflex, & a totally occluded (R). Had a good collateral support to the distal RCA & distal LAD distribution but did have significant LV dysfunction w/EF of 30% & significant akinesia in the LAD distribution. Renals, distal aorta & iliacs were normal.   . Cholecystectomy    . Other surgical history      Broken arm, had pin placed   Family History  Problem Relation Age of Onset  . Heart disease Mother     Heart problems in her 53s (Pt did not specify)  . Hypertension Father     History   Social History  . Marital Status: Married    Spouse Name: N/A  . Number of Children: N/A  . Years of Education:  N/A   Occupational History  . Not on file.   Social History Main Topics  . Smoking status: Former Smoker    Quit date: 12/09/1972  . Smokeless tobacco: Never Used  . Alcohol Use: 1.2 oz/week    2 Cans of beer per week     Comment: Not often  . Drug Use: No  . Sexual Activity: Not on file   Other Topics Concern  . Not on file   Social History Narrative     Objective: BP 153/64 mmHg  Pulse 60  Temp(Src) 97.7 F (36.5 C) (Oral)  Wt 179 lb (81.194 kg)  General: Alert and Oriented, No Acute Distress HEENT: Pupils equal, round, reactive to light. Conjunctivae clear.  External ears unremarkable,  both canals have moderate cerumen neither of which show an impaction  However in the left ear he does have a ball of wax that has quite a bit of ear hair in it that is abutting the eardrum.  Middle ear appears open without effusion. Pink inferior turbinates.  Moist mucous membranes, pharynx without inflammation nor lesions.  Neck supple without palpable lymphadenopathy nor abnormal masses. Lungs: Clear to auscultation bilaterally, no wheezing/ronchi/rales.  Comfortable work of breathing. Good air movement. Cardiac: Regular rate and rhythm. Normal S1/S2.  No murmurs, rubs, nor gallops.   Extremities: No peripheral edema.  Strong peripheral pulses.  Mental Status: No depression, anxiety, nor agitation. Skin: Warm and dry.  Assessment & Plan: Alphonsus was seen today for sinusitis.  Diagnoses and all orders for this visit:  Essential hypertension Orders: -     metoprolol succinate (TOPROL-XL) 25 MG 24 hr tablet; Take 1 tablet (25 mg total) by mouth daily.  Chronic frontal sinusitis Orders: -     fluticasone (FLONASE) 50 MCG/ACT nasal spray; 1 or 2 sprays each nostril twice a day  Excessive ear wax, left    essential hypertension: Uncontrolled chronic condition increasing metoprolol provided he does not get symptomatic bradycardia Chronic frontal sinusitis: Encouraged use fluticasone  on a daily basis, I encouraged him also to put this right next to his razor and to use this in the morning before shaving to minimize his forgetfulness and to try to add this habit on to his daily shaving habit. Excessive ear wax: Discussed using hydrogen peroxide cotton balls in the left ear on a daily basis for the next 2 weeks to see if this helps get rid of his buzzing   Return in about 3 months (around 05/01/2015) for Blood pressure and sinus check.[name]

## 2015-03-09 ENCOUNTER — Other Ambulatory Visit: Payer: Self-pay | Admitting: Family Medicine

## 2015-04-03 ENCOUNTER — Encounter: Payer: Self-pay | Admitting: *Deleted

## 2015-04-03 ENCOUNTER — Emergency Department (INDEPENDENT_AMBULATORY_CARE_PROVIDER_SITE_OTHER): Payer: Medicare Other

## 2015-04-03 ENCOUNTER — Emergency Department (INDEPENDENT_AMBULATORY_CARE_PROVIDER_SITE_OTHER)
Admission: EM | Admit: 2015-04-03 | Discharge: 2015-04-03 | Disposition: A | Discharge status: Home / Self Care | Payer: Medicare Other | Source: Home / Self Care | Attending: Family Medicine | Admitting: Family Medicine

## 2015-04-03 DIAGNOSIS — J069 Acute upper respiratory infection, unspecified: Secondary | ICD-10-CM

## 2015-04-03 DIAGNOSIS — S43421A Sprain of right rotator cuff capsule, initial encounter: Secondary | ICD-10-CM | POA: Diagnosis not present

## 2015-04-03 DIAGNOSIS — B9789 Other viral agents as the cause of diseases classified elsewhere: Principal | ICD-10-CM

## 2015-04-03 DIAGNOSIS — J3089 Other allergic rhinitis: Secondary | ICD-10-CM

## 2015-04-03 DIAGNOSIS — J309 Allergic rhinitis, unspecified: Secondary | ICD-10-CM

## 2015-04-03 DIAGNOSIS — M19011 Primary osteoarthritis, right shoulder: Secondary | ICD-10-CM

## 2015-04-03 MED ORDER — BENZONATATE 200 MG PO CAPS: 200.0000 mg | ORAL_CAPSULE | Freq: Every day | ORAL | Status: DC

## 2015-04-03 MED ORDER — AZITHROMYCIN 250 MG PO TABS: ORAL_TABLET | ORAL | Status: DC

## 2015-04-03 MED ORDER — PREDNISONE 20 MG PO TABS: 20.0000 mg | ORAL_TABLET | Freq: Two times a day (BID) | ORAL | Status: DC

## 2015-04-03 NOTE — Discharge Instructions (Signed)
Take plain guaifenesin (1200mg  extended release tabs such as Mucinex) twice daily, with plenty of water, for cough and congestion.  Get adequate rest.   May use Afrin nasal spray (or generic oxymetazoline) twice daily for about 5 days.  Also recommend using saline nasal spray several times daily and saline nasal irrigation (AYR is a common brand).  Use Flonase nasal spray each morning after using Afrin nasal spray and saline nasal irrigation. Try warm salt water gargles for sore throat.  Stop all antihistamines for now, and other non-prescription cough/cold preparations. Begin Azithromycin if not improving about one week or if persistent fever develops    Apply ice pack to right shoulder for 20 to 30 minutes, 3 to 4 times daily  Continue until pain decreases.  Wear sling.  May take Tylenol for pain Followup with Dr. Aundria Mems ([facility]) if not improving one week.

## 2015-04-03 NOTE — ED Notes (Addendum)
Pt c/o nasal congestion and post nasal drip x 3-4 days. Denies fever. Pt also c/o RT shoulder pain after throwing a garbage bag weighing approximately 25lbs x 2 days ago. He took Tylenol with minimal relief.

## 2015-04-03 NOTE — ED Provider Notes (Signed)
CSN: [id number]     Arrival date & time 04/03/15  1350 History   First MD Initiated Contact with Patient 04/03/15 1416     Chief Complaint  Patient presents with  . Shoulder Injury  . Nasal Congestion      HPI Comments: Patient presents with two problems: 1)  Patient has a history of perennial rhinitis, and complains of four day history of typical cold-like symptoms including mild sore throat, sinus congestion, hoarseness, headache, fatigue, and cough.   2)  Two days ago while throwing a trash bag he developed sudden pain in his right shoulder that has persisted.  The history is provided by the patient.    Past Medical History  Diagnosis Date  . CAD (coronary artery disease)     s/p bypass grafting 06/01/96 with a LIMA to his LAD, a vein to the diagonal branch, a marginal branch, PDA, as well as distal circumflex. ECHO 07/21/96= Normal LV size, wall thickness & global systolic function, EF 45-80%.  Abnormal septal motion compatible with post-operative state w/o major wall motion abnormalities. Aortic sclerosis w/mild aortic regurgitation. Myoview 03/24/12 was non ischemic.  [name] Hypertension   . Bradycardia 04/07/13    EKG 04/07/13 showed sinus bradycardia at 54 bpm.  . Leg pain   . Ankle pain   . Back pain     Chronic  . Broken arm     Had pin placed  . Bronchitis 02/02/2004    Chest CT = Heart vascularity are normal. There is evidence of a previous CABG with some tortuosity of the thoracic aorta. There are no infiltrates or effusions, or other acute abnormalities.  . Postoperative anemia 06/01/96  . MI (myocardial infarction)     Prior to CABG 06/01/1996  . BPH (benign prostatic hyperplasia)   . DOE (dyspnea on exertion)   . Aortic sclerosis     With mild aortic regurgitation  . Mild aortic regurgitation     With aortic sclerosis  . Nocturia     Of 0-1  . Lower extremity edema     Late in the evening  . Palpitations     Occasional  . Hyperlipemia     Statin intolerant.  .  Elevated cholesterol     Mild, statin intolerant.   Past Surgical History  Procedure Laterality Date  . Coronary artery bypass graft  06/04/96    Dr. Cyndia Bent placed LIMA to the LAD, SVG to the DX, SVG to the OM, & SVG to the PDA - distal CFX.  . Cardiac catheterization  06/01/96    60-70% distal (L) main, totally occluded LAD prior to the take off of the (L) of the diagonal w/thrombus present, multiple 60% lesions in the circumflex, & a totally occluded (R). Had a good collateral support to the distal RCA & distal LAD distribution but did have significant LV dysfunction w/EF of 30% & significant akinesia in the LAD distribution. Renals, distal aorta & iliacs were normal.   . Cholecystectomy    . Other surgical history      Broken arm, had pin placed   Family History  Problem Relation Age of Onset  . Heart disease Mother     Heart problems in her 59s (Pt did not specify)  . Hypertension Father    History  Substance Use Topics  . Smoking status: Former Smoker    Quit date: 12/09/1972  . Smokeless tobacco: Never Used  . Alcohol Use: 1.2 oz/week    2 Cans of beer  per week     Comment: Not often    Review of Systems + sore throat + hoarse + cough No pleuritic pain No wheezing + nasal congestion + post-nasal drainage No sinus pain/pressure No itchy/red eyes ? earache No hemoptysis + SOB with activity No fever, + chills No nausea No vomiting No abdominal pain No diarrhea No urinary symptoms No skin rash + fatigue + myalgias + headache + right shoulder pain Used OTC meds without relief  Allergies  Amoxicillin; Asa; Lovaza; Penicillins; Singulair; Statins; and Tricor  Home Medications   Prior to Admission medications   Medication Sig Start Date End Date Taking? Authorizing Provider  azithromycin (ZITHROMAX Z-PAK) 250 MG tablet Take 2 tabs today; then begin one tab once daily for 4 more days. (Rx void after 04/10/15) 04/03/15   Kandra Nicolas, MD  benzonatate (TESSALON)  200 MG capsule Take 1 capsule (200 mg total) by mouth at bedtime. Take as needed for cough 04/03/15   Kandra Nicolas, MD  betamethasone dipropionate (DIPROLENE) 0.05 % cream Apply topically 2 (two) times daily. 12/05/14   Jacqulyn Cane, MD  Coenzyme Q10 (CO Q 10 PO) Take 1 capsule by mouth daily.    Historical Provider, MD  diazepam (VALIUM) 5 MG tablet TAKE 1 TABLET BY MOUTH AT BEDTIME AS NEEDED FOR DIFFICULTY SLEEPING 03/09/15   Sean Hommel, DO  fluticasone (FLONASE) 50 MCG/ACT nasal spray 1 or 2 sprays each nostril twice a day 01/31/15   Sean Hommel, DO  fosinopril (MONOPRIL) 10 MG tablet Take 1 tablet (10 mg total) by mouth daily. 06/27/14   Lorretta Harp, MD  loratadine (CLARITIN) 10 MG tablet Take 10 mg by mouth daily as needed for allergies.    Historical Provider, MD  metoprolol succinate (TOPROL-XL) 25 MG 24 hr tablet Take 1 tablet (25 mg total) by mouth daily. 01/31/15   Marcial Pacas, DO  Multiple Vitamin (MULTIVITAMIN) capsule Take 1 capsule by mouth daily.    Historical Provider, MD  omeprazole (PRILOSEC) 40 MG capsule Take 40 mg by mouth daily as needed.     Historical Provider, MD  predniSONE (DELTASONE) 20 MG tablet Take 1 tablet (20 mg total) by mouth 2 (two) times daily. Take with food. 04/03/15   Kandra Nicolas, MD  VITAMIN D, CHOLECALCIFEROL, PO Take 1 capsule by mouth daily.    Historical Provider, MD   BP 143/77 mmHg  Pulse 55  Temp(Src) 98.3 F (36.8 C) (Oral)  Resp 18  Ht 5\' 9"  (1.753 m)  Wt 175 lb (79.379 kg)  BMI 25.83 kg/m2  SpO2 98% Physical Exam  Constitutional: He is oriented to person, place, and time. He appears well-developed and well-nourished. No distress.  HENT:  Head: Normocephalic.  Mouth/Throat: Oropharynx is clear and moist.  Congested nasal turbinates.  No sinus tenderness.  Eyes: Conjunctivae are normal. Pupils are equal, round, and reactive to light. Right eye exhibits no discharge. Left eye exhibits no discharge.  Neck:  Enlarged tender posterior  cervical nodes palpated  Cardiovascular: Normal heart sounds.   Pulmonary/Chest: Breath sounds normal. He exhibits tenderness.  Musculoskeletal: He exhibits no edema.       Right shoulder: He exhibits decreased range of motion, tenderness, bony tenderness, pain and decreased strength. He exhibits no swelling, no crepitus, no deformity and normal pulse.       Arms: Patient unable to actively abduct right shoulder more than 45 degrees from vertical.  Passive movement similar.  Tenderness to palpation over shoulder and  right medial scapula  as noted on diagram.  There is also tenderness over the long head of biceps.  Apley's test and Empty can test positive.  Hawkins test positive.  Decreased external rotation strength and range of motion.  Good internal rotation strength.                                                                                                                                                                                                                                                                                                              Lymphadenopathy:    He has cervical adenopathy.  Neurological: He is alert and oriented to person, place, and time.  Skin: Skin is warm and dry. No rash noted.  Nursing note and vitals reviewed.   ED Course  Procedures  none  Imaging Review    DG Shoulder Right (Final result) Result time: 04/03/15 15:13:15   Final result by Rad Results In Interface (04/03/15 15:13:15)   Narrative:   CLINICAL DATA: Pt was throwing some trash in a dumpster 2 days ago and is now having rt shoulder pain  EXAM: RIGHT SHOULDER - 2+ VIEW  COMPARISON: None.  FINDINGS: No fracture. AC joint and glenohumeral joint are both normally spaced and aligned. Minor spurring noted at [facility] joint. No other degenerative change. No bone lesion. Soft tissues are unremarkable.  IMPRESSION: Minor AC joint osteoarthritis. No fracture or  dislocation.   Electronically Signed By: Lajean Manes M.D. On: 04/03/2015 15:13      MDM   1. Viral URI with cough   2. Perennial allergic rhinitis   3. Rotator cuff sprain, right, initial encounter    There is no evidence of bacterial infection today.   Begin prednisone burst.  Prescription written for Benzonatate (Tessalon) to take at bedtime for night-time cough.   Take plain guaifenesin (1200mg  extended release tabs such as Mucinex) twice daily, with plenty of water, for cough and congestion.  Get adequate rest.   May use Afrin nasal spray (or generic oxymetazoline) twice daily  for about 5 days.  Also recommend using saline nasal spray several times daily and saline nasal irrigation (AYR is a common brand).  Use Flonase nasal spray each morning after using Afrin nasal spray and saline nasal irrigation. Try warm salt water gargles for sore throat.  Stop all antihistamines for now, and other non-prescription cough/cold preparations. Begin Azithromycin if not improving about one week or if persistent fever develops    Apply ice pack to right shoulder for 20 to 30 minutes, 3 to 4 times daily  Continue until pain decreases.  Wear sling.  May take Tylenol for pain Followup with Dr. Aundria Mems ([facility]) if not improving one week.    Kandra Nicolas, MD 04/07/15 [phone]

## 2015-07-03 ENCOUNTER — Other Ambulatory Visit: Payer: Self-pay | Admitting: Cardiovascular Disease

## 2015-07-14 ENCOUNTER — Other Ambulatory Visit: Payer: Self-pay | Admitting: Family Medicine

## 2015-09-04 ENCOUNTER — Other Ambulatory Visit: Payer: Self-pay | Admitting: Family Medicine

## 2015-09-13 ENCOUNTER — Ambulatory Visit (INDEPENDENT_AMBULATORY_CARE_PROVIDER_SITE_OTHER): Payer: Medicare Other | Admitting: Family Medicine

## 2015-09-13 ENCOUNTER — Other Ambulatory Visit: Payer: Self-pay | Admitting: Family Medicine

## 2015-09-13 ENCOUNTER — Encounter: Payer: Self-pay | Admitting: Family Medicine

## 2015-09-13 VITALS — BP 123/66 | HR 77 | Temp 98.5°F | Wt 177.0 lb

## 2015-09-13 DIAGNOSIS — M7581 Other shoulder lesions, right shoulder: Secondary | ICD-10-CM

## 2015-09-13 DIAGNOSIS — J309 Allergic rhinitis, unspecified: Secondary | ICD-10-CM | POA: Diagnosis not present

## 2015-09-13 MED ORDER — DIAZEPAM 5 MG PO TABS: 5.0000 mg | ORAL_TABLET | Freq: Every evening | ORAL | Status: DC | PRN

## 2015-09-13 MED ORDER — MONTELUKAST SODIUM 10 MG PO TABS: 5.0000 mg | ORAL_TABLET | Freq: Every day | ORAL | Status: DC

## 2015-09-13 NOTE — Progress Notes (Signed)
CC: Randall Compton is a 81 y.o. male is here for Sinusitis and bilateral shoulder pain   Subjective: HPI:  Complains of nasal congestion and postnasal drip present all hours of the day but most bothersome first thing in the morning. Symptoms are moderate at that time of day. Once was getting a benefit from Claritin however this made him feel like he was dehydrated. Nasal congestion is described as clear mucus. Occasionally he will end up coughing because of the drippy sensation down the back of his throat. This is been going on for 2-1/2 months now on a daily basis. Other than above nothing seems to make symptoms better or worse. Denies facial pain, headache, confusion, fevers, chills, wheezing, or shortness of breath.  Complains of right shoulder pain that has been present ever since April of this year. Symptoms came on abruptly when he was throwing a 25 pound bag of garbage into the dumpster at his housing complex. Symptoms have been persistent ever since onset. No benefit from rest or over-the-counter anti-inflammatory. He denies any weakness of the right arm. It's worse with any abduction beyond 90. It's worse when lying on the right shoulder. Its interference with chores around the house and he wants something that can be done about it. Pain is localized within the shoulder and nonradiating.   Review Of Systems Outlined In HPI  Past Medical History  Diagnosis Date  . CAD (coronary artery disease)     s/p bypass grafting 06/01/96 with a LIMA to his LAD, a vein to the diagonal branch, a marginal branch, PDA, as well as distal circumflex. ECHO 07/21/96= Normal LV size, wall thickness & global systolic function, EF 02-77%.  Abnormal septal motion compatible with post-operative state w/o major wall motion abnormalities. Aortic sclerosis w/mild aortic regurgitation. Myoview 03/24/12 was non ischemic.  [name] Hypertension   . Bradycardia 04/07/13    EKG 04/07/13 showed sinus bradycardia at 54 bpm.  . Leg  pain   . Ankle pain   . Back pain     Chronic  . Broken arm     Had pin placed  . Bronchitis 02/02/2004    Chest CT = Heart vascularity are normal. There is evidence of a previous CABG with some tortuosity of the thoracic aorta. There are no infiltrates or effusions, or other acute abnormalities.  . Postoperative anemia 06/01/96  . MI (myocardial infarction) (Randall Compton)     Prior to CABG 06/01/1996  . BPH (benign prostatic hyperplasia)   . DOE (dyspnea on exertion)   . Aortic sclerosis (Randall Compton)     With mild aortic regurgitation  . Mild aortic regurgitation     With aortic sclerosis  . Nocturia     Of 0-1  . Lower extremity edema     Late in the evening  . Palpitations     Occasional  . Hyperlipemia     Statin intolerant.  . Elevated cholesterol     Mild, statin intolerant.    Past Surgical History  Procedure Laterality Date  . Coronary artery bypass graft  06/04/96    Dr. Cyndia Compton placed LIMA to the LAD, SVG to the DX, SVG to the OM, & SVG to the PDA - distal CFX.  . Cardiac catheterization  06/01/96    60-70% distal (L) main, totally occluded LAD prior to the take off of the (L) of the diagonal w/thrombus present, multiple 60% lesions in the circumflex, & a totally occluded (R). Had a good collateral support to the distal  RCA & distal LAD distribution but did have significant LV dysfunction w/EF of 30% & significant akinesia in the LAD distribution. Renals, distal aorta & iliacs were normal.   . Cholecystectomy    . Other surgical history      Broken arm, had pin placed   Family History  Problem Relation Age of Onset  . Heart disease Mother     Heart problems in her 38s (Pt did not specify)  . Hypertension Father     Social History   Social History  . Marital Status: Married    Spouse Name: N/A  . Number of Children: N/A  . Years of Education: N/A   Occupational History  . Not on file.   Social History Main Topics  . Smoking status: Former Smoker    Quit date: 12/09/1972   . Smokeless tobacco: Never Used  . Alcohol Use: 1.2 oz/week    2 Cans of beer per week     Comment: Not often  . Drug Use: No  . Sexual Activity: Not on file   Other Topics Concern  . Not on file   Social History Narrative     Objective: BP 123/66 mmHg  Pulse 77  Temp(Src) 98.5 F (36.9 C) (Oral)  Wt 177 lb (80.287 kg)  General: Alert and Oriented, No Acute Distress HEENT: Pupils equal, round, reactive to light. Conjunctivae clear.  External ears unremarkable, canals clear with intact TMs with appropriate landmarks.  Middle ear appears open without effusion. Pink inferior turbinates.  Moist mucous membranes, pharynx without inflammation nor lesions.  Neck supple without palpable lymphadenopathy nor abnormal masses. Lungs: Clear and comfortable work of breathing Cardiac: Regular rate and rhythm.  Right shoulder exam reveals full range of motion and strength in all planes of motion and with individual rotator cuff testing. No overlying redness warmth or swelling.  Neer's test negative.  Hawkins test positive. Empty can positive. Crossarm test negative. O'Brien's test negative. Apprehension test negative. Speed's test negative. Extremities: No peripheral edema.  Strong peripheral pulses.  Mental Status: No depression, anxiety, nor agitation. Skin: Warm and dry.  Assessment & Plan: [name] was seen today for sinusitis and bilateral shoulder pain.  Diagnoses and all orders for this visit:  Allergic rhinitis, unspecified allergic rhinitis type -     montelukast (SINGULAIR) 10 MG tablet; Take 0.5-1 tablets (5-10 mg total) by mouth at bedtime.  Rotator cuff tendonitis, right  Other orders -     diazepam (VALIUM) 5 MG tablet; Take 1 tablet (5 mg total) by mouth at bedtime as needed.   Allergic rhinitis: Chronic and controlled condition, He has a history of an unknown allergy to montelukast however it was not life-threatening. I recommended that he rechallenge himself with this  medication. If no improvement next up would be Astelin spray Rotator cuff tendinitis of the right shoulder: He was given a home rehabilitation plan to participate in for the next 3 weeks on a daily basis. If no better after that timeframe have asked him to call me to schedule a referral to sports medicine. He tells me that that he sleeps better at night after taking diazepam, it seems to help loosen the muscles around his right shoulder and he doesn't wake up as much for pain.  Return if symptoms worsen or fail to improve, for Follow up with Dr. Georgina Snell or Dr. Darene Lamer in our sports medicine clinic if not improving in 3 weeks.[name]

## 2015-09-27 ENCOUNTER — Other Ambulatory Visit: Payer: Self-pay | Admitting: Family Medicine

## 2015-09-27 ENCOUNTER — Other Ambulatory Visit: Payer: Self-pay | Admitting: Cardiovascular Disease

## 2015-10-03 ENCOUNTER — Ambulatory Visit (INDEPENDENT_AMBULATORY_CARE_PROVIDER_SITE_OTHER): Payer: Medicare Other | Admitting: Family Medicine

## 2015-10-03 ENCOUNTER — Encounter: Payer: Self-pay | Admitting: Family Medicine

## 2015-10-03 VITALS — Wt 178.7 lb

## 2015-10-03 DIAGNOSIS — M62838 Other muscle spasm: Secondary | ICD-10-CM | POA: Diagnosis not present

## 2015-10-03 DIAGNOSIS — J309 Allergic rhinitis, unspecified: Secondary | ICD-10-CM

## 2015-10-03 DIAGNOSIS — Z23 Encounter for immunization: Secondary | ICD-10-CM | POA: Diagnosis not present

## 2015-10-03 MED ORDER — DIAZEPAM 5 MG PO TABS: 5.0000 mg | ORAL_TABLET | Freq: Every evening | ORAL | Status: DC | PRN

## 2015-10-03 MED ORDER — PREDNISONE 20 MG PO TABS: ORAL_TABLET | ORAL | Status: AC

## 2015-10-03 MED ORDER — AZELASTINE HCL 0.1 % NA SOLN: 2.0000 | Freq: Two times a day (BID) | NASAL | Status: DC

## 2015-10-03 NOTE — Progress Notes (Signed)
CC: Randall Compton is a 81 y.o. male is here for URI   Subjective: HPI:  Thick mucus in the back of the throat that has been present off and on for the past month. He had no benefit after a few days of taking Singulair so stopped it. He continues on Flonase daily. Symptoms seem to be worse in the evenings. Nothing else seems to make better or worse. Symptoms are at least mild in severity. He reports nasal congestion but no difficulty breathing out of his nose. He denies sore throat, fevers, chills, headache, rash, confusion, difficulty swallowing, cough or shortness of breath.  He tells me he misplaced his diazepam prescription from earlier this month and would like a new copy  Review Of Systems Outlined In HPI  Past Medical History  Diagnosis Date  . CAD (coronary artery disease)     s/p bypass grafting 06/01/96 with a LIMA to his LAD, a vein to the diagonal branch, a marginal branch, PDA, as well as distal circumflex. ECHO 07/21/96= Normal LV size, wall thickness & global systolic function, EF 29-93%.  Abnormal septal motion compatible with post-operative state w/o major wall motion abnormalities. Aortic sclerosis w/mild aortic regurgitation. Myoview 03/24/12 was non ischemic.  [name] Hypertension   . Bradycardia 04/07/13    EKG 04/07/13 showed sinus bradycardia at 54 bpm.  . Leg pain   . Ankle pain   . Back pain     Chronic  . Broken arm     Had pin placed  . Bronchitis 02/02/2004    Chest CT = Heart vascularity are normal. There is evidence of a previous CABG with some tortuosity of the thoracic aorta. There are no infiltrates or effusions, or other acute abnormalities.  . Postoperative anemia 06/01/96  . MI (myocardial infarction) ([place])     Prior to CABG 06/01/1996  . BPH (benign prostatic hyperplasia)   . DOE (dyspnea on exertion)   . Aortic sclerosis ([facility])     With mild aortic regurgitation  . Mild aortic regurgitation     With aortic sclerosis  . Nocturia     Of 0-1  . Lower  extremity edema     Late in the evening  . Palpitations     Occasional  . Hyperlipemia     Statin intolerant.  . Elevated cholesterol     Mild, statin intolerant.    Past Surgical History  Procedure Laterality Date  . Coronary artery bypass graft  06/04/96    Dr. Cyndia Bent placed LIMA to the LAD, SVG to the DX, SVG to the OM, & SVG to the PDA - distal CFX.  . Cardiac catheterization  06/01/96    60-70% distal (L) main, totally occluded LAD prior to the take off of the (L) of the diagonal w/thrombus present, multiple 60% lesions in the circumflex, & a totally occluded (R). Had a good collateral support to the distal RCA & distal LAD distribution but did have significant LV dysfunction w/EF of 30% & significant akinesia in the LAD distribution. Renals, distal aorta & iliacs were normal.   . Cholecystectomy    . Other surgical history      Broken arm, had pin placed   Family History  Problem Relation Age of Onset  . Heart disease Mother     Heart problems in her 87s (Pt did not specify)  . Hypertension Father     Social History   Social History  . Marital Status: Married    Spouse Name:  N/A  . Number of Children: N/A  . Years of Education: N/A   Occupational History  . Not on file.   Social History Main Topics  . Smoking status: Former Smoker    Quit date: 12/09/1972  . Smokeless tobacco: Never Used  . Alcohol Use: 1.2 oz/week    2 Cans of beer per week     Comment: Not often  . Drug Use: No  . Sexual Activity: Not on file   Other Topics Concern  . Not on file   Social History Narrative     Objective: Wt 178 lb 11.2 oz (81.058 kg)  General: Alert and Oriented, No Acute Distress HEENT: Pupils equal, round, reactive to light. Conjunctivae clear.  External ears unremarkable, canals clear with intact TMs with appropriate landmarks.  Middle ear appears open without effusion. Pink inferior turbinates.  Moist mucous membranes, pharynx without inflammation nor lesions other  than mild postnasal drip  Neck supple without palpable lymphadenopathy nor abnormal masses. Lungs: Clear to auscultation bilaterally, no wheezing/ronchi/rales.  Comfortable work of breathing. Good air movement. Extremities: No peripheral edema.  Strong peripheral pulses.  Mental Status: No depression, anxiety, nor agitation. Skin: Warm and dry.  Assessment & Plan: Randall Compton was seen today for uri.  Diagnoses and all orders for this visit:  Allergic rhinitis, unspecified allergic rhinitis type -     predniSONE (DELTASONE) 20 MG tablet; Three tabs daily days 1-3, two tabs daily days 4-6, one tab daily days 7-9, half tab daily days 10-13. -     azelastine (ASTELIN) 0.1 % nasal spray; Place 2 sprays into both nostrils 2 (two) times daily. Use in each nostril as directed  Muscle spasm -     diazepam (VALIUM) 5 MG tablet; Take 1 tablet (5 mg total) by mouth at bedtime as needed.   Allergic rhinitis: Start prednisone taper along with Astelin. Use Astelin for the rest of the fall season.  Return if symptoms worsen or fail to improve.

## 2015-10-10 ENCOUNTER — Other Ambulatory Visit: Payer: Self-pay | Admitting: Family Medicine

## 2015-10-21 ENCOUNTER — Other Ambulatory Visit: Payer: Self-pay | Admitting: Family Medicine

## 2015-12-21 ENCOUNTER — Other Ambulatory Visit: Payer: Self-pay | Admitting: Family Medicine

## 2015-12-27 ENCOUNTER — Other Ambulatory Visit: Payer: Self-pay | Admitting: Cardiovascular Disease

## 2016-01-05 ENCOUNTER — Other Ambulatory Visit: Payer: Self-pay | Admitting: Cardiovascular Disease

## 2016-01-11 ENCOUNTER — Other Ambulatory Visit: Payer: Self-pay

## 2016-01-11 ENCOUNTER — Other Ambulatory Visit: Payer: Self-pay | Admitting: Cardiovascular Disease

## 2016-01-11 MED ORDER — FOSINOPRIL SODIUM 10 MG PO TABS: 10.0000 mg | ORAL_TABLET | Freq: Every day | ORAL | Status: DC

## 2016-01-15 ENCOUNTER — Ambulatory Visit (INDEPENDENT_AMBULATORY_CARE_PROVIDER_SITE_OTHER): Payer: 59 | Admitting: Family Medicine

## 2016-01-15 ENCOUNTER — Encounter: Payer: Self-pay | Admitting: Family Medicine

## 2016-01-15 VITALS — BP 133/63 | HR 58 | Temp 97.9°F | Wt 179.0 lb

## 2016-01-15 DIAGNOSIS — Z0000 Encounter for general adult medical examination without abnormal findings: Secondary | ICD-10-CM

## 2016-01-15 DIAGNOSIS — I10 Essential (primary) hypertension: Secondary | ICD-10-CM | POA: Diagnosis not present

## 2016-01-15 DIAGNOSIS — M62838 Other muscle spasm: Secondary | ICD-10-CM | POA: Diagnosis not present

## 2016-01-15 DIAGNOSIS — B9689 Other specified bacterial agents as the cause of diseases classified elsewhere: Secondary | ICD-10-CM

## 2016-01-15 DIAGNOSIS — J329 Chronic sinusitis, unspecified: Secondary | ICD-10-CM | POA: Diagnosis not present

## 2016-01-15 DIAGNOSIS — A499 Bacterial infection, unspecified: Secondary | ICD-10-CM

## 2016-01-15 MED ORDER — DIAZEPAM 5 MG PO TABS: 5.0000 mg | ORAL_TABLET | Freq: Every evening | ORAL | Status: DC | PRN

## 2016-01-15 MED ORDER — FOSINOPRIL SODIUM 10 MG PO TABS: 10.0000 mg | ORAL_TABLET | Freq: Every day | ORAL | Status: DC

## 2016-01-15 MED ORDER — DOXYCYCLINE HYCLATE 100 MG PO TABS: ORAL_TABLET | ORAL | Status: AC

## 2016-01-15 MED ORDER — PREDNISONE 20 MG PO TABS: ORAL_TABLET | ORAL | Status: AC

## 2016-01-15 MED ORDER — METOPROLOL SUCCINATE ER 25 MG PO TB24: 12.5000 mg | ORAL_TABLET | Freq: Every day | ORAL | Status: DC

## 2016-01-15 NOTE — Progress Notes (Signed)
CC: Randall Compton is a 82 y.o. male is here for Sinusitis   Subjective: HPI:  He initially set this appointment up for physical however would like to talk about his sinus pressure. It's been going on for the last 3 weeks. It's localized in the forehead with postnasal drip and a runny nose. Occasional cough. Denies fevers, chills. Slight improvement with Claritin however causes intolerable dry mouth. Symptoms are moderate in severity occurring all hours of the day. Not interfering with sleep. Denies any motor or sensory disturbances. Denies shortness of breath or wheezing.  He would like a refill on his blood pressure medication. No outside blood pressures to report.  He would like to get his blood work done prior to his wellness exam so he can go over the numbers during that encounter.   Review Of Systems Outlined In HPI  Past Medical History  Diagnosis Date  . CAD (coronary artery disease)     s/p bypass grafting 06/01/96 with a LIMA to his LAD, a vein to the diagonal branch, a marginal branch, PDA, as well as distal circumflex. ECHO 07/21/96= Normal LV size, wall thickness & global systolic function, EF 0000000.  Abnormal septal motion compatible with post-operative state w/o major wall motion abnormalities. Aortic sclerosis w/mild aortic regurgitation. Myoview 03/24/12 was non ischemic.  [name] Hypertension   . Bradycardia 04/07/13    EKG 04/07/13 showed sinus bradycardia at 54 bpm.  . Leg pain   . Ankle pain   . Back pain     Chronic  . Broken arm     Had pin placed  . Bronchitis 02/02/2004    Chest CT = Heart vascularity are normal. There is evidence of a previous CABG with some tortuosity of the thoracic aorta. There are no infiltrates or effusions, or other acute abnormalities.  . Postoperative anemia 06/01/96  . MI (myocardial infarction) ([facility])     Prior to CABG 06/01/1996  . BPH (benign prostatic hyperplasia)   . DOE (dyspnea on exertion)   . Aortic sclerosis ([facility])     With mild  aortic regurgitation  . Mild aortic regurgitation     With aortic sclerosis  . Nocturia     Of 0-1  . Lower extremity edema     Late in the evening  . Palpitations     Occasional  . Hyperlipemia     Statin intolerant.  . Elevated cholesterol     Mild, statin intolerant.    Past Surgical History  Procedure Laterality Date  . Coronary artery bypass graft  06/04/96    Dr. Cyndia Bent placed LIMA to the LAD, SVG to the DX, SVG to the OM, & SVG to the PDA - distal CFX.  . Cardiac catheterization  06/01/96    60-70% distal (L) main, totally occluded LAD prior to the take off of the (L) of the diagonal w/thrombus present, multiple 60% lesions in the circumflex, & a totally occluded (R). Had a good collateral support to the distal RCA & distal LAD distribution but did have significant LV dysfunction w/EF of 30% & significant akinesia in the LAD distribution. Renals, distal aorta & iliacs were normal.   . Cholecystectomy    . Other surgical history      Broken arm, had pin placed   Family History  Problem Relation Age of Onset  . Heart disease Mother     Heart problems in her 29s (Pt did not specify)  . Hypertension Father     Social History  Social History  . Marital Status: Married    Spouse Name: N/A  . Number of Children: N/A  . Years of Education: N/A   Occupational History  . Not on file.   Social History Main Topics  . Smoking status: Former Smoker    Quit date: 12/09/1972  . Smokeless tobacco: Never Used  . Alcohol Use: 1.2 oz/week    2 Cans of beer per week     Comment: Not often  . Drug Use: No  . Sexual Activity: Not on file   Other Topics Concern  . Not on file   Social History Narrative     Objective: BP 133/63 mmHg  Pulse 58  Temp(Src) 97.9 F (36.6 C) (Oral)  Wt 179 lb (81.194 kg)  General: Alert and Oriented, No Acute Distress HEENT: Pupils equal, round, reactive to light. Conjunctivae clear.  External ears unremarkable, canals clear with intact TMs  with appropriate landmarks.  Middle ear appears open without effusion. Pink inferior turbinates.  Moist mucous membranes, pharynx without inflammation nor lesions.  Neck supple without palpable lymphadenopathy nor abnormal masses. Lungs: Clear to auscultation bilaterally, no wheezing/ronchi/rales.  Comfortable work of breathing. Good air movement. Extremities: No peripheral edema.  Strong peripheral pulses.  Mental Status: No depression, anxiety, nor agitation. Skin: Warm and dry.  Assessment & Plan: Randall Compton was seen today for sinusitis.  Diagnoses and all orders for this visit:  Muscle spasm -     diazepam (VALIUM) 5 MG tablet; Take 1 tablet (5 mg total) by mouth at bedtime as needed. -     Lipid panel -     COMPLETE METABOLIC PANEL WITH GFR -     PSA -     CBC  Essential hypertension -     metoprolol succinate (TOPROL-XL) 25 MG 24 hr tablet; Take 0.5 tablets (12.5 mg total) by mouth daily. -     Lipid panel -     COMPLETE METABOLIC PANEL WITH GFR -     PSA -     CBC  Annual physical exam -     Lipid panel -     COMPLETE METABOLIC PANEL WITH GFR -     PSA -     CBC  Bacterial sinusitis -     predniSONE (DELTASONE) 20 MG tablet; Three tabs daily days 1-3, two tabs daily days 4-6, one tab daily days 7-9, half tab daily days 10-13. -     doxycycline (VIBRA-TABS) 100 MG tablet; One by mouth twice a day for ten days.  Other orders -     fosinopril (MONOPRIL) 10 MG tablet; Take 1 tablet (10 mg total) by mouth daily.   Requesting refill on diazepam for muscle spasms, seems reasonable Essential hypertension: Controlled continue metoprolol and Monopril Start doxycycline and prednisone for bacterial sinusitis, hold off on getting blood work for a physical until he has completed this regimen.  Return if symptoms worsen or fail to improve.

## 2016-02-09 ENCOUNTER — Other Ambulatory Visit: Payer: Self-pay | Admitting: Family Medicine

## 2016-02-28 LAB — CBC
HCT: 35 % — ABNORMAL LOW (ref 39.0–52.0)
Hemoglobin: 11.7 g/dL — ABNORMAL LOW (ref 13.0–17.0)
MCH: 28.9 pg (ref 26.0–34.0)
MCHC: 33.4 g/dL (ref 30.0–36.0)
MCV: 86.4 fL (ref 78.0–100.0)
MPV: 9.4 fL (ref 8.6–12.4)
PLATELETS: 160 10*3/uL (ref 150–400)
RBC: 4.05 MIL/uL — AB (ref 4.22–5.81)
RDW: 14.7 % (ref 11.5–15.5)
WBC: 6.1 10*3/uL (ref 4.0–10.5)

## 2016-02-28 LAB — COMPLETE METABOLIC PANEL WITH GFR
ALBUMIN: 4.3 g/dL (ref 3.6–5.1)
ALK PHOS: 46 U/L (ref 40–115)
ALT: 9 U/L (ref 9–46)
AST: 14 U/L (ref 10–35)
BUN: 9 mg/dL (ref 7–25)
CALCIUM: 8.8 mg/dL (ref 8.6–10.3)
CHLORIDE: 105 mmol/L (ref 98–110)
CO2: 27 mmol/L (ref 20–31)
Creat: 1.03 mg/dL (ref 0.70–1.11)
GFR, EST NON AFRICAN AMERICAN: 67 mL/min (ref >=60)
GFR, Est African American: 78 mL/min (ref >=60)
GLUCOSE: 96 mg/dL (ref 65–99)
POTASSIUM: 3.9 mmol/L (ref 3.5–5.3)
SODIUM: 140 mmol/L (ref 135–146)
Total Bilirubin: 1.5 mg/dL — ABNORMAL HIGH (ref 0.2–1.2)
Total Protein: 6.2 g/dL (ref 6.1–8.1)

## 2016-02-28 LAB — LIPID PANEL
CHOL/HDL RATIO: 4.1 ratio (ref <=5.0)
Cholesterol: 116 mg/dL — ABNORMAL LOW (ref 125–200)
HDL: 28 mg/dL — AB (ref >=40)
LDL Cholesterol: 70 mg/dL (ref <130)
Triglycerides: 89 mg/dL (ref <150)
VLDL: 18 mg/dL (ref <30)

## 2016-02-29 LAB — PSA

## 2016-03-22 ENCOUNTER — Other Ambulatory Visit: Payer: Self-pay | Admitting: Family Medicine

## 2016-06-13 ENCOUNTER — Other Ambulatory Visit: Payer: Self-pay | Admitting: Family Medicine

## 2016-08-06 ENCOUNTER — Other Ambulatory Visit: Payer: Self-pay | Admitting: Family Medicine

## 2016-09-09 ENCOUNTER — Other Ambulatory Visit: Payer: Self-pay | Admitting: *Deleted

## 2016-09-09 MED ORDER — FLUTICASONE PROPIONATE 50 MCG/ACT NA SUSP: NASAL | 0 refills | Status: DC

## 2016-09-10 ENCOUNTER — Other Ambulatory Visit: Payer: Self-pay | Admitting: Family Medicine

## 2016-09-10 DIAGNOSIS — M62838 Other muscle spasm: Secondary | ICD-10-CM

## 2016-09-18 ENCOUNTER — Encounter: Payer: Self-pay | Admitting: Family Medicine

## 2016-09-18 ENCOUNTER — Ambulatory Visit (INDEPENDENT_AMBULATORY_CARE_PROVIDER_SITE_OTHER): Payer: 59 | Admitting: Family Medicine

## 2016-09-18 VITALS — BP 106/57 | HR 53 | Wt 176.0 lb

## 2016-09-18 DIAGNOSIS — Z23 Encounter for immunization: Secondary | ICD-10-CM

## 2016-09-18 DIAGNOSIS — G4700 Insomnia, unspecified: Secondary | ICD-10-CM

## 2016-09-18 DIAGNOSIS — I10 Essential (primary) hypertension: Secondary | ICD-10-CM | POA: Diagnosis not present

## 2016-09-18 DIAGNOSIS — M62838 Other muscle spasm: Secondary | ICD-10-CM

## 2016-09-18 DIAGNOSIS — C61 Malignant neoplasm of prostate: Secondary | ICD-10-CM

## 2016-09-18 DIAGNOSIS — I2510 Atherosclerotic heart disease of native coronary artery without angina pectoris: Secondary | ICD-10-CM | POA: Diagnosis not present

## 2016-09-18 DIAGNOSIS — E785 Hyperlipidemia, unspecified: Secondary | ICD-10-CM

## 2016-09-18 MED ORDER — RANITIDINE HCL 300 MG PO TABS: 300.0000 mg | ORAL_TABLET | Freq: Two times a day (BID) | ORAL | 3 refills | Status: DC

## 2016-09-18 MED ORDER — DIAZEPAM 5 MG PO TABS: 5.0000 mg | ORAL_TABLET | Freq: Every evening | ORAL | 5 refills | Status: DC | PRN

## 2016-09-18 NOTE — Patient Instructions (Signed)
Thank you for coming in today. Get fasting labs soon.  Return for recheck for a well visit in the near future.

## 2016-09-18 NOTE — Progress Notes (Signed)
Randall Compton is a 82 y.o. male who presents to [facility] today for skin lesions, Acid reflux, and insomnia.  Skin lesions: Patient has small hyperpigmented skin lesions that have been present for the last several years. He would like these evaluated. They do not bother him and are not painful.  Acid reflux: Patient has a history of acid reflux. He uses omeprazole intermittently. Previously when taking continuous Nexium he developed anemia. He notes the omeprazole helps some but doesn't work very quickly. Most of the time he is asymptomatic and does not require any medications.  Additionally he notes insomnia. He takes Valium occasionally at night to sleep. He's been doing this for 30 years and is very committed to his medication. He is well aware that this may increase his risk of falls or confusion and may lead to early death.    Past Medical History:  Diagnosis Date  . Ankle pain   . Aortic sclerosis    With mild aortic regurgitation  . Back pain    Chronic  . BPH (benign prostatic hyperplasia)   . Bradycardia 04/07/13   EKG 04/07/13 showed sinus bradycardia at 54 bpm.  . Broken arm    Had pin placed  . Bronchitis 02/02/2004   Chest CT = Heart vascularity are normal. There is evidence of a previous CABG with some tortuosity of the thoracic aorta. There are no infiltrates or effusions, or other acute abnormalities.  [name] CAD (coronary artery disease)    s/p bypass grafting 06/01/96 with a LIMA to his LAD, a vein to the diagonal branch, a marginal branch, PDA, as well as distal circumflex. ECHO 07/21/96= Normal LV size, wall thickness & global systolic function, EF 0000000.  Abnormal septal motion compatible with post-operative state w/o major wall motion abnormalities. Aortic sclerosis w/mild aortic regurgitation. Myoview 03/24/12 was non ischemic.  [name] DOE (dyspnea on  exertion)   . Elevated cholesterol    Mild, statin intolerant.  . Hyperlipemia    Statin intolerant.  . Hypertension   . Leg pain   . Lower extremity edema    Late in the evening  . MI (myocardial infarction)    Prior to CABG 06/01/1996  . Mild aortic regurgitation    With aortic sclerosis  . Nocturia    Of 0-1  . Palpitations    Occasional  . Postoperative anemia 06/01/96   Past Surgical History:  Procedure Laterality Date  . CARDIAC CATHETERIZATION  06/01/96   60-70% distal (L) main, totally occluded LAD prior to the take off of the (L) of the diagonal w/thrombus present, multiple 60% lesions in the circumflex, & a totally occluded (R). Had a good collateral support to the distal RCA & distal LAD distribution but did have significant LV dysfunction w/EF of 30% & significant akinesia in the LAD distribution. Renals, distal aorta & iliacs were normal.   . CHOLECYSTECTOMY    . CORONARY ARTERY BYPASS GRAFT  06/04/96   Dr. Cyndia Bent placed LIMA to the LAD, SVG to the DX, SVG to the OM, & SVG to the PDA - distal CFX.  . OTHER SURGICAL HISTORY     Broken arm, had pin placed   Social History  Substance Use Topics  . Smoking status: Former Smoker    Quit date: 12/09/1972  . Smokeless tobacco: Never Used  . Alcohol use 1.2 oz/week    2 Cans of beer per week  Comment: Not often   family history includes Heart disease in his mother; Hypertension in his father.  ROS as above:  Medications: Current Outpatient Prescriptions  Medication Sig Dispense Refill  . azelastine (ASTELIN) 0.1 % nasal spray Place 2 sprays into both nostrils 2 (two) times daily. Use in each nostril as directed 30 mL 12  . Coenzyme Q10 (CO Q 10 PO) Take 1 capsule by mouth daily.    . diazepam (VALIUM) 5 MG tablet Take 1 tablet (5 mg total) by mouth at bedtime as needed for anxiety. 30 tablet 5  . fluticasone (FLONASE) 50 MCG/ACT nasal spray USE 1 OR 2 SPRAYS EACH NOSTRIL TWICE A DAY 16 g 0  . fluticasone (FLONASE)  50 MCG/ACT nasal spray USE 1 OR 2 SPRAYS EACH NOSTRIL TWICE A DAY 48 g 0  . fosinopril (MONOPRIL) 10 MG tablet Take 1 tablet (10 mg total) by mouth daily. 90 tablet 2  . metoprolol succinate (TOPROL-XL) 25 MG 24 hr tablet Take 0.5 tablets (12.5 mg total) by mouth daily. 45 tablet 2  . Multiple Vitamin (MULTIVITAMIN) capsule Take 1 capsule by mouth daily.    . ranitidine (ZANTAC) 300 MG tablet Take 1 tablet (300 mg total) by mouth 2 (two) times daily. 180 tablet 3   No current facility-administered medications for this visit.    Allergies  Allergen Reactions  . Amoxicillin   . Asa [Aspirin] Other (See Comments)    GI upset  . Lovaza [Omega-3-Acid Ethyl Esters]   . Penicillins   . Singulair [Montelukast Sodium]     Unknown side effects  . Statins     Liver inflammation and joint pain.  Iver Nestle [Fenofibrate] Other (See Comments)    Joint swelling     Exam:  BP (!) 106/57   Pulse (!) 53   Wt 176 lb (79.8 kg)   BMI 25.99 kg/m   Gen: Well NAD HEENT: EOMI,  MMM Lungs: Normal work of breathing. CTABL Heart: RRR no MRG Abd: NABS, Soft. Nondistended, Nontender Exts: Brisk capillary refill, warm and well perfused.  Skin: Multiple seborrheic keratoses on trunk and face. No dysplastic nevi present  No results found for this or any previous visit (from the past 24 hour(s)). No results found.    Assessment and Plan: 82 y.o. male with  Skin lesions: Seborrheic keratosis. Plan for watchful waiting is they are not symptomatic.  Acid reflux: Intermittent. Will transition from PPI to faster acting ranitidine twice daily as needed for intermittent acid reflux symptoms. Would like to avoid daily PPI due to history of malabsorption  Anemia.  Insomnia: We had a long conversation about risk of Valium for insomnia in the elderly. He seems to be doing well currently and is very resistant to trying a different medication. He understands his risk of falls confusion delirium or death due to this  medication.  Attending fasting labs in preparation for wellness exam in the near future. Vaccines updated list below.    Orders Placed This Encounter  Procedures  . Pneumococcal conjugate vaccine 13-valent IM  . Flu vaccine HIGH DOSE PF  . Tdap vaccine greater than or equal to 7yo IM  . CBC  . COMPLETE METABOLIC PANEL WITH GFR  . Lipid panel  . TSH  . Hemoglobin A1c    Discussed warning signs or symptoms. Please see discharge instructions. Patient expresses understanding.

## 2016-10-17 ENCOUNTER — Other Ambulatory Visit: Payer: Self-pay

## 2016-10-17 MED ORDER — FOSINOPRIL SODIUM 10 MG PO TABS: 10.0000 mg | ORAL_TABLET | Freq: Every day | ORAL | 0 refills | Status: DC

## 2016-10-21 LAB — CBC
HCT: 36.3 % — ABNORMAL LOW (ref 38.5–50.0)
Hemoglobin: 11.9 g/dL — ABNORMAL LOW (ref 13.2–17.1)
MCH: 29.1 pg (ref 27.0–33.0)
MCHC: 32.8 g/dL (ref 32.0–36.0)
MCV: 88.8 fL (ref 80.0–100.0)
MPV: 10.2 fL (ref 7.5–12.5)
PLATELETS: 167 10*3/uL (ref 140–400)
RBC: 4.09 MIL/uL — ABNORMAL LOW (ref 4.20–5.80)
RDW: 14.2 % (ref 11.0–15.0)
WBC: 5.8 10*3/uL (ref 3.8–10.8)

## 2016-10-21 LAB — HEMOGLOBIN A1C
Hgb A1c MFr Bld: 4.8 % (ref <5.7)
Mean Plasma Glucose: 91 mg/dL

## 2016-10-22 ENCOUNTER — Encounter: Payer: Self-pay | Admitting: Family Medicine

## 2016-10-22 DIAGNOSIS — N183 Chronic kidney disease, stage 3 unspecified: Secondary | ICD-10-CM | POA: Insufficient documentation

## 2016-10-22 LAB — COMPLETE METABOLIC PANEL WITH GFR
ALT: 9 U/L (ref 9–46)
AST: 16 U/L (ref 10–35)
Albumin: 4.4 g/dL (ref 3.6–5.1)
Alkaline Phosphatase: 47 U/L (ref 40–115)
BILIRUBIN TOTAL: 1.3 mg/dL — AB (ref 0.2–1.2)
BUN: 13 mg/dL (ref 7–25)
CHLORIDE: 105 mmol/L (ref 98–110)
CO2: 28 mmol/L (ref 20–31)
Calcium: 9.2 mg/dL (ref 8.6–10.3)
Creat: 1.24 mg/dL — ABNORMAL HIGH (ref 0.70–1.11)
GFR, EST AFRICAN AMERICAN: 62 mL/min (ref >=60)
GFR, EST NON AFRICAN AMERICAN: 54 mL/min — AB (ref >=60)
Glucose, Bld: 103 mg/dL — ABNORMAL HIGH (ref 65–99)
Potassium: 4.5 mmol/L (ref 3.5–5.3)
Sodium: 140 mmol/L (ref 135–146)
Total Protein: 6.5 g/dL (ref 6.1–8.1)

## 2016-10-22 LAB — LIPID PANEL
Cholesterol: 138 mg/dL (ref <200)
HDL: 27 mg/dL — AB (ref >40)
LDL CALC: 81 mg/dL (ref <100)
TRIGLYCERIDES: 149 mg/dL (ref <150)
Total CHOL/HDL Ratio: 5.1 Ratio — ABNORMAL HIGH (ref <5.0)
VLDL: 30 mg/dL (ref <30)

## 2016-10-22 LAB — TSH: TSH: 2.13 mIU/L (ref 0.40–4.50)

## 2016-11-04 ENCOUNTER — Encounter: Payer: Self-pay | Admitting: Family Medicine

## 2016-11-04 ENCOUNTER — Ambulatory Visit (INDEPENDENT_AMBULATORY_CARE_PROVIDER_SITE_OTHER): Payer: 59 | Admitting: Family Medicine

## 2016-11-04 VITALS — BP 131/51 | HR 45 | Wt 180.0 lb

## 2016-11-04 DIAGNOSIS — Z0000 Encounter for general adult medical examination without abnormal findings: Secondary | ICD-10-CM

## 2016-11-04 NOTE — Patient Instructions (Signed)
Thank you for coming in today. Return for recheck in 6 months or so.  Return sooner if needed.

## 2016-11-04 NOTE — Progress Notes (Signed)
Subjective:    Randall Compton is a 82 y.o. male who presents for Medicare Annual/Subsequent preventive examination.   Preventive Screening-Counseling & Management  Tobacco History  Smoking Status  . Former Smoker  . Quit date: 12/09/1972  Smokeless Tobacco  . Never Used    Problems Prior to Visit 1.   Current Problems (verified) Patient Active Problem List   Diagnosis Date Noted  . CKD (chronic kidney disease) stage 3, GFR 30-59 ml/min 10/22/2016  . Prostate cancer ([facility]) 10/31/2014  . History of coronary artery bypass graft 10/17/2014  . History of prostate cancer 10/17/2014  . Chronic frontal sinusitis 10/17/2014  . Insomnia 10/17/2014  . Coronary artery disease 05/10/2014  . Essential hypertension 05/10/2014  . Hyperlipidemia 05/10/2014    Medications Prior to Visit Current Outpatient Prescriptions on File Prior to Visit  Medication Sig Dispense Refill  . azelastine (ASTELIN) 0.1 % nasal spray Place 2 sprays into both nostrils 2 (two) times daily. Use in each nostril as directed 30 mL 12  . Coenzyme Q10 (CO Q 10 PO) Take 1 capsule by mouth daily.    . diazepam (VALIUM) 5 MG tablet Take 1 tablet (5 mg total) by mouth at bedtime as needed for anxiety. 30 tablet 5  . fluticasone (FLONASE) 50 MCG/ACT nasal spray USE 1 OR 2 SPRAYS EACH NOSTRIL TWICE A DAY 16 g 0  . fluticasone (FLONASE) 50 MCG/ACT nasal spray USE 1 OR 2 SPRAYS EACH NOSTRIL TWICE A DAY 48 g 0  . fosinopril (MONOPRIL) 10 MG tablet Take 1 tablet (10 mg total) by mouth daily. Patient needs lab work. 90 tablet 0  . metoprolol succinate (TOPROL-XL) 25 MG 24 hr tablet Take 0.5 tablets (12.5 mg total) by mouth daily. 45 tablet 2  . Multiple Vitamin (MULTIVITAMIN) capsule Take 1 capsule by mouth daily.    . ranitidine (ZANTAC) 300 MG tablet Take 1 tablet (300 mg total) by mouth 2 (two) times daily. 180 tablet 3   No current facility-administered medications on file prior to visit.     Current Medications  (verified) Current Outpatient Prescriptions  Medication Sig Dispense Refill  . azelastine (ASTELIN) 0.1 % nasal spray Place 2 sprays into both nostrils 2 (two) times daily. Use in each nostril as directed 30 mL 12  . Coenzyme Q10 (CO Q 10 PO) Take 1 capsule by mouth daily.    . diazepam (VALIUM) 5 MG tablet Take 1 tablet (5 mg total) by mouth at bedtime as needed for anxiety. 30 tablet 5  . fluticasone (FLONASE) 50 MCG/ACT nasal spray USE 1 OR 2 SPRAYS EACH NOSTRIL TWICE A DAY 16 g 0  . fluticasone (FLONASE) 50 MCG/ACT nasal spray USE 1 OR 2 SPRAYS EACH NOSTRIL TWICE A DAY 48 g 0  . fosinopril (MONOPRIL) 10 MG tablet Take 1 tablet (10 mg total) by mouth daily. Patient needs lab work. 90 tablet 0  . metoprolol succinate (TOPROL-XL) 25 MG 24 hr tablet Take 0.5 tablets (12.5 mg total) by mouth daily. 45 tablet 2  . Multiple Vitamin (MULTIVITAMIN) capsule Take 1 capsule by mouth daily.    . ranitidine (ZANTAC) 300 MG tablet Take 1 tablet (300 mg total) by mouth 2 (two) times daily. 180 tablet 3   No current facility-administered medications for this visit.      Allergies (verified) Amoxicillin; Asa [aspirin]; Lovaza [omega-3-acid ethyl esters]; Penicillins; Singulair [montelukast sodium]; Statins; and Tricor [fenofibrate]   PAST HISTORY  Family History Family History  Problem Relation Age of  Onset  . Heart disease Mother     Heart problems in her 55s (Pt did not specify)  . Hypertension Father     Social History Social History  Substance Use Topics  . Smoking status: Former Smoker    Quit date: 12/09/1972  . Smokeless tobacco: Never Used  . Alcohol use 1.2 oz/week    2 Cans of beer per week     Comment: Not often    Are there smokers in your home (other than you)?  No  Risk Factors Current exercise habits: Lots of gardening and housework  Dietary issues discussed: Reduce calorie and reduce carbohydrate   Cardiac risk factors: advanced age (older than 34 for men, 21 for women),  diabetes mellitus, dyslipidemia, family history of premature cardiovascular disease, hypertension, male gender and obesity (BMI >= 30 kg/m2).  Depression Screen (Note: if answer to either of the following is "Yes", a more complete depression screening is indicated)   Q1: Over the past two weeks, have you felt down, depressed or hopeless? No  Q2: Over the past two weeks, have you felt little interest or pleasure in doing things? Yes  Have you lost interest or pleasure in daily life? No  Do you often feel hopeless? No  Do you cry easily over simple problems? No  Activities of Daily Living In your present state of health, do you have any difficulty performing the following activities?:  Driving? No Managing money?  No Feeding yourself? No Getting from bed to chair? No Climbing a flight of stairs? No Preparing food and eating?: No Bathing or showering? No Getting dressed: No Getting to the toilet? No Using the toilet:No Moving around from place to place: No In the past year have you fallen or had a near fall?:No     Hearing Difficulties: Yes Do you often ask people to speak up or repeat themselves? Yes Do you experience ringing or noises in your ears? Yes Do you have difficulty understanding soft or whispered voices? Yes   Do you feel that you have a problem with memory? No  Do you often misplace items? No  Do you feel safe at home?  Yes  Cognitive Testing  Alert? Yes  Normal Appearance?Yes  Oriented to person? Yes  Place? Yes   Time? Yes  Recall of three objects?  Yes  Can perform simple calculations? Yes  Displays appropriate judgment?Yes  Can read the correct time from a watch face?Yes   Advanced Directives have been discussed with the patient? Yes   List the Names of Other Physician/Practitioners you currently use: 1.    Indicate any recent Medical Services you may have received from other than [facility] providers in the past year (date may be  approximate).  Immunization History  Administered Date(s) Administered  . Influenza, High Dose Seasonal PF 09/18/2016  . Influenza-Unspecified 09/08/2014, 10/03/2015  . Pneumococcal Conjugate-13 09/18/2016  . Pneumococcal-Unspecified 12/09/2013  . Tdap 09/18/2016  . Zoster 12/09/2010    Screening Tests Health Maintenance  Topic Date Due  . TETANUS/TDAP  09/18/2026  . INFLUENZA VACCINE  Completed  . ZOSTAVAX  Completed  . PNA vac Low Risk Adult  Completed    All answers were reviewed with the patient and necessary referrals were made:  Lynne Leader, MD   11/04/2016   History reviewed: allergies, current medications, past family history, past medical history, past social history, past surgical history and problem list  Review of Systems Pertinent items noted in HPI and remainder of comprehensive  ROS otherwise negative.    Objective:     Vision by Snellen chart: right eye:20/25, left eye:20/40 Blood pressure (!) 131/51, pulse (!) 45, weight 180 lb (81.6 kg). Body mass index is 26.58 kg/m.       Assessment:     Medicare well adult exam. Doing reasonably well. Minimal fall risk. Discussed advanced directive.     Plan:     During the course of the visit the patient was educated and counseled about appropriate screening and preventive services including:    Nutrition counseling   Advanced directives: has NO advanced directive  - add't info requested. Referral to SW: Paperwork for advanced directive given  Diet review for nutrition referral? Yes ____  Not Indicated _xx___   Patient Instructions (the written plan) was given to the patient.  Medicare Attestation I have personally reviewed: The patient's medical and social history Their use of alcohol, tobacco or illicit drugs Their current medications and supplements The patient's functional ability including ADLs,fall risks, home safety risks, cognitive, and hearing and visual impairment Diet and physical  activities Evidence for depression or mood disorders  The patient's weight, height, BMI, and visual acuity have been recorded in the chart.  I have made referrals, counseling, and provided education to the patient based on review of the above and I have provided the patient with a written personalized care plan for preventive services.     Lynne Leader, MD   11/04/2016

## 2016-12-31 ENCOUNTER — Other Ambulatory Visit: Payer: Self-pay | Admitting: Family Medicine

## 2017-01-02 NOTE — Telephone Encounter (Signed)
Refill requested for Flonase. Former [facility] pt. Is a refill ok?

## 2017-02-05 ENCOUNTER — Other Ambulatory Visit: Payer: Self-pay

## 2017-02-05 DIAGNOSIS — I10 Essential (primary) hypertension: Secondary | ICD-10-CM

## 2017-02-05 MED ORDER — METOPROLOL SUCCINATE ER 25 MG PO TB24: 12.5000 mg | ORAL_TABLET | Freq: Every day | ORAL | 2 refills | Status: DC

## 2017-04-10 ENCOUNTER — Other Ambulatory Visit: Payer: Self-pay | Admitting: Family Medicine

## 2017-04-18 ENCOUNTER — Other Ambulatory Visit: Payer: Self-pay | Admitting: *Deleted

## 2017-04-18 MED ORDER — FOSINOPRIL SODIUM 10 MG PO TABS: 10.0000 mg | ORAL_TABLET | Freq: Every day | ORAL | 0 refills | Status: DC

## 2017-05-20 ENCOUNTER — Other Ambulatory Visit: Payer: Self-pay | Admitting: Family Medicine

## 2017-05-20 DIAGNOSIS — M62838 Other muscle spasm: Secondary | ICD-10-CM

## 2017-05-20 NOTE — Telephone Encounter (Signed)
Pt request refill on controlled substance. Please advise.

## 2017-05-23 ENCOUNTER — Other Ambulatory Visit: Payer: Self-pay | Admitting: Family Medicine

## 2017-05-23 DIAGNOSIS — M62838 Other muscle spasm: Secondary | ICD-10-CM

## 2017-06-20 ENCOUNTER — Emergency Department (INDEPENDENT_AMBULATORY_CARE_PROVIDER_SITE_OTHER)
Admission: EM | Admit: 2017-06-20 | Discharge: 2017-06-20 | Disposition: A | Discharge status: Home / Self Care | Payer: 59 | Source: Home / Self Care | Attending: Family Medicine | Admitting: Family Medicine

## 2017-06-20 ENCOUNTER — Emergency Department (INDEPENDENT_AMBULATORY_CARE_PROVIDER_SITE_OTHER): Payer: 59

## 2017-06-20 DIAGNOSIS — R05 Cough: Secondary | ICD-10-CM | POA: Diagnosis not present

## 2017-06-20 DIAGNOSIS — G8929 Other chronic pain: Secondary | ICD-10-CM

## 2017-06-20 DIAGNOSIS — M25512 Pain in left shoulder: Secondary | ICD-10-CM

## 2017-06-20 DIAGNOSIS — R51 Headache: Secondary | ICD-10-CM | POA: Diagnosis not present

## 2017-06-20 DIAGNOSIS — R0981 Nasal congestion: Secondary | ICD-10-CM

## 2017-06-20 DIAGNOSIS — R053 Chronic cough: Secondary | ICD-10-CM

## 2017-06-20 MED ORDER — BENZONATATE 100 MG PO CAPS
100.0000 mg | ORAL_CAPSULE | Freq: Three times a day (TID) | ORAL | 0 refills | Status: DC
Start: 2017-06-20 — End: 2017-08-18

## 2017-06-20 MED ORDER — AZITHROMYCIN 250 MG PO TABS: 250.0000 mg | ORAL_TABLET | Freq: Every day | ORAL | 0 refills | Status: DC

## 2017-06-20 NOTE — Discharge Instructions (Signed)
°  Your cough could be due to a viral illness, similar to the one that caused your wife's recent pneumonia.  You may try the benzonatate cough pills to see if that helps with your cough.  If you are not improving in 3-4 days, or if you develop a fever >100.4*F, chest tightness or shortness of breath, you may start taking the antibiotic- Azithromycin.  Be sure to take the entire course of antibiotics even if you start feeling better to make sure the infection does not come back.  If your cough continues even after using the cough medication and antibiotic, your cough could be due to your blood pressure medication fosinopril (Monopril).  This medication is known as an ACE-inhibitor and a dry nagging cough is a common side effect even if you have taking the medication for many years without side effects.  Please speak with your primary care provider, Dr. Georgina Snell, to see if he can change this medication to a different type of blood pressure medication for you to see if this helps your cough.

## 2017-06-20 NOTE — ED Triage Notes (Signed)
Sinus issues for several years.  Has a dry cough all the time.  Has started having a "sinus" headache this week.  Wife was Dx with pneumonia earlier this week. Thinks he may have a pinched nerve in neck.  Has had pain in the bicep, shoulder and neck on left side for about 1 year.

## 2017-06-20 NOTE — ED Provider Notes (Signed)
CSN: [id number]     Arrival date & time 06/20/17  1412 History   First MD Initiated Contact with Patient 06/20/17 1440     Chief Complaint  Patient presents with  . Facial Pain  . Extremity Pain  . Shoulder Pain   (Consider location/radiation/quality/duration/timing/severity/associated sxs/prior Treatment) HPI Randall Compton is a 83 y.o. male presenting to UC with c/o sinus issues for years with cough and congestion.  He reports having a dry cough "all the time."  This past week he did start to develop a sinus headache and notes his wife was dx with pneumonia early this week. She had minimal symptoms.  His wife encouraged him to come be evaluated today.  He is also c/o a pinched nerve in his neck that radiates down his Left arm.  This has been going on for about 1 year. Denies new injuries. He takes diazepam and Tylenol at night, which helps relax his muscles and helps with the pain. He does not like taking anything during the day for pain as he knows too much medication is bad for his kidney and lives.    Past Medical History:  Diagnosis Date  . Ankle pain   . Aortic sclerosis    With mild aortic regurgitation  . Back pain    Chronic  . BPH (benign prostatic hyperplasia)   . Bradycardia 04/07/13   EKG 04/07/13 showed sinus bradycardia at 54 bpm.  . Broken arm    Had pin placed  . Bronchitis 02/02/2004   Chest CT = Heart vascularity are normal. There is evidence of a previous CABG with some tortuosity of the thoracic aorta. There are no infiltrates or effusions, or other acute abnormalities.  [name] CAD (coronary artery disease)    s/p bypass grafting 06/01/96 with a LIMA to his LAD, a vein to the diagonal branch, a marginal branch, PDA, as well as distal circumflex. ECHO 07/21/96= Normal LV size, wall thickness & global systolic function, EF 90-30%.  Abnormal septal motion compatible with post-operative state w/o major wall motion abnormalities. Aortic sclerosis w/mild aortic regurgitation.  Myoview 03/24/12 was non ischemic.  [name] DOE (dyspnea on exertion)   . Elevated cholesterol    Mild, statin intolerant.  . Hyperlipemia    Statin intolerant.  . Hypertension   . Leg pain   . Lower extremity edema    Late in the evening  . MI (myocardial infarction) ([place])    Prior to CABG 06/01/1996  . Mild aortic regurgitation    With aortic sclerosis  . Nocturia    Of 0-1  . Palpitations    Occasional  . Postoperative anemia 06/01/96   Past Surgical History:  Procedure Laterality Date  . CARDIAC CATHETERIZATION  06/01/96   60-70% distal (L) main, totally occluded LAD prior to the take off of the (L) of the diagonal w/thrombus present, multiple 60% lesions in the circumflex, & a totally occluded (R). Had a good collateral support to the distal RCA & distal LAD distribution but did have significant LV dysfunction w/EF of 30% & significant akinesia in the LAD distribution. Renals, distal aorta & iliacs were normal.   . CHOLECYSTECTOMY    . CORONARY ARTERY BYPASS GRAFT  06/04/96   Dr. Cyndia Bent placed LIMA to the LAD, SVG to the DX, SVG to the OM, & SVG to the PDA - distal CFX.  . OTHER SURGICAL HISTORY     Broken arm, had pin placed   Family History  Problem Relation Age of  Onset  . Heart disease Mother        Heart problems in her 55s (Pt did not specify)  . Hypertension Father    Social History  Substance Use Topics  . Smoking status: Former Smoker    Quit date: 12/09/1972  . Smokeless tobacco: Never Used  . Alcohol use 1.2 oz/week    2 Cans of beer per week     Comment: Not often    Review of Systems  Constitutional: Negative for chills and fever.  HENT: Positive for congestion, postnasal drip and sinus pressure. Negative for ear pain, sore throat, trouble swallowing and voice change.   Respiratory: Positive for cough. Negative for shortness of breath.   Cardiovascular: Negative for chest pain and palpitations.  Gastrointestinal: Negative for abdominal pain, diarrhea, nausea  and vomiting.  Musculoskeletal: Positive for arthralgias and myalgias. Negative for back pain.  Skin: Negative for rash.  Neurological: Positive for headaches. Negative for dizziness and light-headedness.    Allergies  Amoxicillin; Penicillins; Asa [aspirin]; Lovaza [omega-3-acid ethyl esters]; Singulair [montelukast sodium]; Statins; and Tricor [fenofibrate]  Home Medications   Prior to Admission medications   Medication Sig Start Date End Date Taking? Authorizing Provider  azelastine (ASTELIN) 0.1 % nasal spray Place 2 sprays into both nostrils 2 (two) times daily. Use in each nostril as directed 10/03/15   Hommel, Sean, DO  azithromycin (ZITHROMAX) 250 MG tablet Take 1 tablet (250 mg total) by mouth daily. Take first 2 tablets together, then 1 every day until finished. 06/20/17   Noe Gens, PA-C  benzonatate (TESSALON) 100 MG capsule Take 1-2 capsules (100-200 mg total) by mouth every 8 (eight) hours. 06/20/17   Noe Gens, PA-C  Coenzyme Q10 (CO Q 10 PO) Take 1 capsule by mouth daily.    [provider]  diazepam (VALIUM) 5 MG tablet TAKE 1 TABLET BY MOUTH AT BEDTIME AS NEEDED 05/22/17   Gregor Hams, MD  fluticasone [facility]) 50 MCG/ACT nasal spray USE 1 OR 2 SPRAYS EACH NOSTRIL TWICE A DAY 08/06/16   Hommel, Sean, DO  fluticasone (FLONASE) 50 MCG/ACT nasal spray USE 1 OR 2 SPRAYS EACH NOSTRIL TWICE A DAY 01/03/17   Gregor Hams, MD  fosinopril (MONOPRIL) 10 MG tablet Take 1 tablet (10 mg total) by mouth daily. Due for follow up visit 04/18/17   Gregor Hams, MD  metoprolol succinate (TOPROL-XL) 25 MG 24 hr tablet Take 0.5 tablets (12.5 mg total) by mouth daily. 02/05/17   Gregor Hams, MD  Multiple Vitamin (MULTIVITAMIN) capsule Take 1 capsule by mouth daily.    [provider]  ranitidine (ZANTAC) 300 MG tablet Take 1 tablet (300 mg total) by mouth 2 (two) times daily. 09/18/16   Gregor Hams, MD   Meds Ordered and Administered this Visit  Medications - No  data to display  BP (!) 158/65 (BP Location: Left Arm)   Pulse (!) 55   Temp 98 F (36.7 C) (Oral)   Ht 5\' 8"  (1.727 m)   Wt 179 lb (81.2 kg)   SpO2 98%   BMI 27.22 kg/m  No data found.   Physical Exam  Constitutional: He is oriented to person, place, and time. He appears well-developed and well-nourished. No distress.  HENT:  Head: Normocephalic and atraumatic.  Right Ear: Tympanic membrane normal.  Left Ear: Tympanic membrane normal.  Nose: Nose normal.  Mouth/Throat: Uvula is midline, oropharynx is clear and moist and mucous membranes are normal.  Eyes:  EOM are normal.  Neck: Normal range of motion. Neck supple.  Cardiovascular: Normal rate and regular rhythm.   Pulmonary/Chest: Effort normal. No respiratory distress. He has decreased breath sounds in the right lower field and the left lower field. He has no wheezes. He has rhonchi (mild diffuse, clears with cough). He has no rales.  Musculoskeletal: Normal range of motion.  Neurological: He is alert and oriented to person, place, and time.  Skin: Skin is warm and dry. He is not diaphoretic.  Psychiatric: He has a normal mood and affect. His behavior is normal.  Nursing note and vitals reviewed.   Urgent Care Course     Procedures (including critical care time)  Labs Review Labs Reviewed - No data to display  Imaging Review Dg Chest 2 View  Result Date: 06/20/2017 CLINICAL DATA:  Dry cough and headache for 1 week. EXAM: CHEST  2 VIEW COMPARISON:  12/05/2014 FINDINGS: The deep cardiac silhouette, mediastinal and hilar contours are stable. Moderate tortuosity of the thoracic aorta. Stable surgical changes from triple bypass surgery. Stable mild eventration of the right hemidiaphragm. No acute pulmonary findings. No pleural effusion. The bony thorax is intact. IMPRESSION: No acute cardiopulmonary findings. Electronically Signed   By: Marijo Sanes M.D.   On: 06/20/2017 15:08     MDM   1. Chronic cough   2. Head  congestion   3. Chronic left shoulder pain    Pt c/o worsening sinus symptoms but has hx of chronic sinus congestion and dry cough. Wife had pneumonia earlier this week. Vitals: unremarkable  CXR: no evidence of pneumonia Will try conservative treatment with Tessalon, if not improving, may fill prescription for Azithromycin  Question if chronic cough is due to pt's fosinopril. Encouraged to f/u with his PCP, Dr. Georgina Snell, next week for recheck of symptoms and to discuss chronic cough.    Noe Gens, [place] 06/20/17 [phone]

## 2017-07-15 ENCOUNTER — Other Ambulatory Visit: Payer: Self-pay | Admitting: Family Medicine

## 2017-08-18 ENCOUNTER — Encounter: Payer: Self-pay | Admitting: Family Medicine

## 2017-08-18 ENCOUNTER — Ambulatory Visit (INDEPENDENT_AMBULATORY_CARE_PROVIDER_SITE_OTHER): Payer: 59 | Admitting: Family Medicine

## 2017-08-18 VITALS — BP 141/61 | HR 52 | Temp 98.9°F | Ht 68.5 in | Wt 176.4 lb

## 2017-08-18 DIAGNOSIS — Z23 Encounter for immunization: Secondary | ICD-10-CM

## 2017-08-18 DIAGNOSIS — C61 Malignant neoplasm of prostate: Secondary | ICD-10-CM

## 2017-08-18 DIAGNOSIS — I2510 Atherosclerotic heart disease of native coronary artery without angina pectoris: Secondary | ICD-10-CM | POA: Diagnosis not present

## 2017-08-18 DIAGNOSIS — I10 Essential (primary) hypertension: Secondary | ICD-10-CM

## 2017-08-18 DIAGNOSIS — H6122 Impacted cerumen, left ear: Secondary | ICD-10-CM

## 2017-08-18 DIAGNOSIS — N183 Chronic kidney disease, stage 3 unspecified: Secondary | ICD-10-CM

## 2017-08-18 DIAGNOSIS — E782 Mixed hyperlipidemia: Secondary | ICD-10-CM

## 2017-08-18 DIAGNOSIS — M62838 Other muscle spasm: Secondary | ICD-10-CM

## 2017-08-18 MED ORDER — DIAZEPAM 5 MG PO TABS: 5.0000 mg | ORAL_TABLET | Freq: Every evening | ORAL | 5 refills | Status: DC | PRN

## 2017-08-18 MED ORDER — FOSINOPRIL SODIUM 10 MG PO TABS: 10.0000 mg | ORAL_TABLET | Freq: Every day | ORAL | 1 refills | Status: DC

## 2017-08-18 MED ORDER — METOPROLOL SUCCINATE ER 25 MG PO TB24: 12.5000 mg | ORAL_TABLET | Freq: Every day | ORAL | 2 refills | Status: DC

## 2017-08-18 MED ORDER — NEOMYCIN-POLYMYXIN-HC 3.5-10000-1 OT SOLN: 3.0000 [drp] | Freq: Four times a day (QID) | OTIC | 0 refills | Status: DC

## 2017-08-18 NOTE — Patient Instructions (Addendum)
Thank you for coming in today. Get fasting labs in the near future to follow up chronic diseases.  Continue current medications.  Call or go to the emergency room if you get worse, have trouble breathing, have chest pains, or palpitations.   Use the ear drops for a few days.  Let me know if you do not get better.    Earwax Buildup, Adult The ears produce a substance called earwax that helps keep bacteria out of the ear and protects the skin in the ear canal. Occasionally, earwax can build up in the ear and cause discomfort or hearing loss. What increases the risk? This condition is more likely to develop in people who:  Are male.  Are elderly.  Naturally produce more earwax.  Clean their ears often with cotton swabs.  Use earplugs often.  Use in-ear headphones often.  Wear hearing aids.  Have narrow ear canals.  Have earwax that is overly thick or sticky.  Have eczema.  Are dehydrated.  Have excess hair in the ear canal.  What are the signs or symptoms? Symptoms of this condition include:  Reduced or muffled hearing.  A feeling of fullness in the ear or feeling that the ear is plugged.  Fluid coming from the ear.  Ear pain.  Ear itch.  Ringing in the ear.  Coughing.  An obvious piece of earwax that can be seen inside the ear canal.  How is this diagnosed? This condition may be diagnosed based on:  Your symptoms.  Your medical history.  An ear exam. During the exam, your health care provider will look into your ear with an instrument called an otoscope.  You may have tests, including a hearing test. How is this treated? This condition may be treated by:  Using ear drops to soften the earwax.  Having the earwax removed by a health care provider. The health care provider may: ? Flush the ear with water. ? Use an instrument that has a loop on the end (curette). ? Use a suction device.  Surgery to remove the wax buildup. This may be done in  severe cases.  Follow these instructions at home:  Take over-the-counter and prescription medicines only as told by your health care provider.  Do not put any objects, including cotton swabs, into your ear. You can clean the opening of your ear canal with a washcloth or facial tissue.  Follow instructions from your health care provider about cleaning your ears. Do not over-clean your ears.  Drink enough fluid to keep your urine clear or pale yellow. This will help to thin the earwax.  Keep all follow-up visits as told by your health care provider. If earwax builds up in your ears often or if you use hearing aids, consider seeing your health care provider for routine, preventive ear cleanings. Ask your health care provider how often you should schedule your cleanings.  If you have hearing aids, clean them according to instructions from the manufacturer and your health care provider. Contact a health care provider if:  You have ear pain.  You develop a fever.  You have blood, pus, or other fluid coming from your ear.  You have hearing loss.  You have ringing in your ears that does not go away.  Your symptoms do not improve with treatment.  You feel like the room is spinning (vertigo). Summary  Earwax can build up in the ear and cause discomfort or hearing loss.  The most common symptoms of this  condition include reduced or muffled hearing and a feeling of fullness in the ear or feeling that the ear is plugged.  This condition may be diagnosed based on your symptoms, your medical history, and an ear exam.  This condition may be treated by using ear drops to soften the earwax or by having the earwax removed by a health care provider.  Do not put any objects, including cotton swabs, into your ear. You can clean the opening of your ear canal with a washcloth or facial tissue. This information is not intended to replace advice given to you by your health care provider. Make sure you  discuss any questions you have with your health care provider. Document Released: 01/02/2005 Document Revised: 02/05/2017 Document Reviewed: 02/05/2017 Elsevier Interactive Patient Education  [employer].

## 2017-08-18 NOTE — Progress Notes (Signed)
Randall Compton is a 83 y.o. male who presents to [place]: [place] today for left ear pain and follow-up hypertension, sinus congestion, and prostate cancer.   Patient notes left ear pain for approximately one week. He denies any change in hearing fevers or chills vomiting or diarrhea. He notes pain is worse with motion. He has not tried any treatment.  Hypertension: Patient continues to take medications listed below with no chest pain palpitations shortness of breath lightheadedness or dizziness.  Sinus Congestion: Patient continues to use Astelin daily. He sometimes still has some nasal discharge especially during allergy season. He wonders if taking Flonase nasal spray is okay in addition.  Prostate cancer: Patient has a history of prostate cancer treated with radioactive seed. He is asymptomatic and doing well.  Muscle spasms: Patient has muscle spasms and uses Valium intermittently. He has this works well and allows him to function. He denies any obnoxious side effects like confusion or falls.  Past Medical History:  Diagnosis Date  . Ankle pain   . Aortic sclerosis    With mild aortic regurgitation  . Back pain    Chronic  . BPH (benign prostatic hyperplasia)   . Bradycardia 04/07/13   EKG 04/07/13 showed sinus bradycardia at 54 bpm.  . Broken arm    Had pin placed  . Bronchitis 02/02/2004   Chest CT = Heart vascularity are normal. There is evidence of a previous CABG with some tortuosity of the thoracic aorta. There are no infiltrates or effusions, or other acute abnormalities.  [name] CAD (coronary artery disease)    s/p bypass grafting 06/01/96 with a LIMA to his LAD, a vein to the diagonal branch, a marginal branch, PDA, as well as distal circumflex. ECHO 07/21/96= Normal LV size, wall thickness & global systolic function, EF 71-24%.  Abnormal septal motion compatible  with post-operative state w/o major wall motion abnormalities. Aortic sclerosis w/mild aortic regurgitation. Myoview 03/24/12 was non ischemic.  [name] DOE (dyspnea on exertion)   . Elevated cholesterol    Mild, statin intolerant.  . Hyperlipemia    Statin intolerant.  . Hypertension   . Leg pain   . Lower extremity edema    Late in the evening  . MI (myocardial infarction) ([place])    Prior to CABG 06/01/1996  . Mild aortic regurgitation    With aortic sclerosis  . Nocturia    Of 0-1  . Palpitations    Occasional  . Postoperative anemia 06/01/96   Past Surgical History:  Procedure Laterality Date  . CARDIAC CATHETERIZATION  06/01/96   60-70% distal (L) main, totally occluded LAD prior to the take off of the (L) of the diagonal w/thrombus present, multiple 60% lesions in the circumflex, & a totally occluded (R). Had a good collateral support to the distal RCA & distal LAD distribution but did have significant LV dysfunction w/EF of 30% & significant akinesia in the LAD distribution. Renals, distal aorta & iliacs were normal.   . CHOLECYSTECTOMY    . CORONARY ARTERY BYPASS GRAFT  06/04/96   Dr. Cyndia Bent placed LIMA to the LAD, SVG to the DX, SVG to the OM, & SVG to the PDA - distal CFX.  . OTHER SURGICAL HISTORY     Broken arm, had pin placed   Social History  Substance Use Topics  . Smoking status: Former Smoker    Quit date: 12/09/1972  . Smokeless tobacco: Never Used  .  Alcohol use 1.2 oz/week    2 Cans of beer per week     Comment: Not often   family history includes Heart disease in his mother; Hypertension in his father.  ROS as above:  Medications: Current Outpatient Prescriptions  Medication Sig Dispense Refill  . azelastine (ASTELIN) 0.1 % nasal spray Place 2 sprays into both nostrils 2 (two) times daily. Use in each nostril as directed 30 mL 12  . Coenzyme Q10 (CO Q 10 PO) Take 1 capsule by mouth daily.    . diazepam (VALIUM) 5 MG tablet Take 1 tablet (5 mg total) by mouth at  bedtime as needed. 30 tablet 5  . fosinopril (MONOPRIL) 10 MG tablet Take 1 tablet (10 mg total) by mouth daily. 90 tablet 1  . metoprolol succinate (TOPROL-XL) 25 MG 24 hr tablet Take 0.5 tablets (12.5 mg total) by mouth daily. 45 tablet 2  . Multiple Vitamin (MULTIVITAMIN) capsule Take 1 capsule by mouth daily.    . ranitidine (ZANTAC) 300 MG tablet Take 1 tablet (300 mg total) by mouth 2 (two) times daily. 180 tablet 3  . fluticasone (FLONASE) 50 MCG/ACT nasal spray USE 1 OR 2 SPRAYS EACH NOSTRIL TWICE A DAY (Patient not taking: Reported on 08/18/2017) 16 g 0  . neomycin-polymyxin-hydrocortisone (CORTISPORIN) OTIC solution Place 3 drops into the left ear 4 (four) times daily. 10 mL 0   No current facility-administered medications for this visit.    Allergies  Allergen Reactions  . Amoxicillin Anaphylaxis  . Penicillins Anaphylaxis  . Asa [Aspirin] Other (See Comments)    GI upset  . Lovaza [Omega-3-Acid Ethyl Esters]   . Singulair [Montelukast Sodium]     Unknown side effects  . Statins     Liver inflammation and joint pain.  Iver Nestle [Fenofibrate] Other (See Comments)    Joint swelling    Health Maintenance Health Maintenance  Topic Date Due  . TETANUS/TDAP  09/18/2026  . INFLUENZA VACCINE  Completed  . PNA vac Low Risk Adult  Completed     Exam:  BP (!) 141/61   Pulse (!) 52   Temp 98.9 F (37.2 C)   Ht 5' 8.5" (1.74 m)   Wt 176 lb 6.4 oz (80 kg)   SpO2 98%   BMI 26.43 kg/m  Gen: Well NAD HEENT: EOMI,  MMM Left ear cerumen impaction. Following removal of cerumen in ear canal is erythematous Lungs: Normal work of breathing. CTABL Heart: RRR no MRG Abd: NABS, Soft. Nondistended, Nontender Exts: Brisk capillary refill, warm and well perfused.     No results found for this or any previous visit (from the past 72 hour(s)). No results found.    Assessment and Plan: 83 y.o. male with  Left ear pain likely cerumen impaction with mild otitis externa. Treatment  with Cortisporin eardrops.  Hypertension: Blood pressure goal. Plan to continue current medications and check fasting labs listed below.  Prostate cancer doing well check PSA  Muscle spasms doing well refill Valium. Discussed risks and benefits of this medication.   Orders Placed This Encounter  Procedures  . Flu vaccine HIGH DOSE PF (Fluzone High dose)  . CBC  . COMPLETE METABOLIC PANEL WITH GFR  . Lipid Panel w/reflex Direct LDL  . PSA   Meds ordered this encounter  Medications  . diazepam (VALIUM) 5 MG tablet    Sig: Take 1 tablet (5 mg total) by mouth at bedtime as needed.    Dispense:  30 tablet  Refill:  5  . fosinopril (MONOPRIL) 10 MG tablet    Sig: Take 1 tablet (10 mg total) by mouth daily.    Dispense:  90 tablet    Refill:  1  . metoprolol succinate (TOPROL-XL) 25 MG 24 hr tablet    Sig: Take 0.5 tablets (12.5 mg total) by mouth daily.    Dispense:  45 tablet    Refill:  2  . neomycin-polymyxin-hydrocortisone (CORTISPORIN) OTIC solution    Sig: Place 3 drops into the left ear 4 (four) times daily.    Dispense:  10 mL    Refill:  0     Discussed warning signs or symptoms. Please see discharge instructions. Patient expresses understanding.

## 2017-09-01 LAB — COMPLETE METABOLIC PANEL WITH GFR
AG Ratio: 2.6 (calc) — ABNORMAL HIGH (ref 1.0–2.5)
ALT: 9 U/L (ref 9–46)
AST: 16 U/L (ref 10–35)
Albumin: 4.6 g/dL (ref 3.6–5.1)
Alkaline phosphatase (APISO): 44 U/L (ref 40–115)
BUN/Creatinine Ratio: 10 (calc) (ref 6–22)
BUN: 12 mg/dL (ref 7–25)
CALCIUM: 8.9 mg/dL (ref 8.6–10.3)
CO2: 28 mmol/L (ref 20–32)
CREATININE: 1.24 mg/dL — AB (ref 0.70–1.11)
Chloride: 106 mmol/L (ref 98–110)
GFR, EST NON AFRICAN AMERICAN: 53 mL/min/{1.73_m2} — AB (ref >=60)
GFR, Est African American: 62 mL/min/{1.73_m2} (ref >=60)
Globulin: 1.8 g/dL (calc) — ABNORMAL LOW (ref 1.9–3.7)
Glucose, Bld: 100 mg/dL — ABNORMAL HIGH (ref 65–99)
POTASSIUM: 4.3 mmol/L (ref 3.5–5.3)
Sodium: 140 mmol/L (ref 135–146)
Total Bilirubin: 1.3 mg/dL — ABNORMAL HIGH (ref 0.2–1.2)
Total Protein: 6.4 g/dL (ref 6.1–8.1)

## 2017-09-01 LAB — CBC
HCT: 33.7 % — ABNORMAL LOW (ref 38.5–50.0)
Hemoglobin: 11.5 g/dL — ABNORMAL LOW (ref 13.2–17.1)
MCH: 29.2 pg (ref 27.0–33.0)
MCHC: 34.1 g/dL (ref 32.0–36.0)
MCV: 85.5 fL (ref 80.0–100.0)
MPV: 10.8 fL (ref 7.5–12.5)
PLATELETS: 167 10*3/uL (ref 140–400)
RBC: 3.94 10*6/uL — AB (ref 4.20–5.80)
RDW: 13.6 % (ref 11.0–15.0)
WBC: 7.7 10*3/uL (ref 3.8–10.8)

## 2017-09-01 LAB — LIPID PANEL W/REFLEX DIRECT LDL
CHOL/HDL RATIO: 4.4 (calc) (ref <5.0)
Cholesterol: 124 mg/dL (ref <200)
HDL: 28 mg/dL — AB (ref >40)
LDL Cholesterol (Calc): 76 mg/dL (calc)
NON-HDL CHOLESTEROL (CALC): 96 mg/dL (ref <130)
Triglycerides: 110 mg/dL (ref <150)

## 2017-09-01 LAB — PSA: PSA: <0.1 ng/mL (ref <=4.0)

## 2017-10-27 ENCOUNTER — Ambulatory Visit (INDEPENDENT_AMBULATORY_CARE_PROVIDER_SITE_OTHER): Payer: Medicare Other | Admitting: Family Medicine

## 2017-10-27 ENCOUNTER — Encounter: Payer: Self-pay | Admitting: Family Medicine

## 2017-10-27 VITALS — BP 139/59 | HR 62 | Temp 98.7°F | Resp 16 | Ht 68.0 in | Wt 178.1 lb

## 2017-10-27 DIAGNOSIS — M7582 Other shoulder lesions, left shoulder: Secondary | ICD-10-CM

## 2017-10-27 DIAGNOSIS — J029 Acute pharyngitis, unspecified: Secondary | ICD-10-CM

## 2017-10-27 MED ORDER — PREDNISONE 10 MG PO TABS: 30.0000 mg | ORAL_TABLET | Freq: Every day | ORAL | 0 refills | Status: DC

## 2017-10-27 NOTE — Progress Notes (Signed)
Randall Compton is a 83 y.o. male who presents to [facility]: [facility] today for sore throat and left shoulder pain.   Khup notes a several day history of sore throat. He denies fevers chills vomiting or diarrhea. He notes pain with swallowing sometimes. He denies any trouble swallowing. He thinks his uvula is slightly larger than usual. He's tried some over-the-counter medicines for treatment which have helped a bit.  Additionally he notes left shoulder pain. This is been ongoing for a few weeks. He thinks the pain worsened after he did a lot of lifting around with yard work. He notes pain in the lateral upper arm worse with overhead motion and at night. He denies any radiating pain weakness or numbness. He's not had any treatment for this.   Past Medical History:  Diagnosis Date  . Ankle pain   . Aortic sclerosis    With mild aortic regurgitation  . Back pain    Chronic  . BPH (benign prostatic hyperplasia)   . Bradycardia 04/07/13   EKG 04/07/13 showed sinus bradycardia at 54 bpm.  . Broken arm    Had pin placed  . Bronchitis 02/02/2004   Chest CT = Heart vascularity are normal. There is evidence of a previous CABG with some tortuosity of the thoracic aorta. There are no infiltrates or effusions, or other acute abnormalities.  [name] CAD (coronary artery disease)    s/p bypass grafting 06/01/96 with a LIMA to his LAD, a vein to the diagonal branch, a marginal branch, PDA, as well as distal circumflex. ECHO 07/21/96= Normal LV size, wall thickness & global systolic function, EF 28-31%.  Abnormal septal motion compatible with post-operative state w/o major wall motion abnormalities. Aortic sclerosis w/mild aortic regurgitation. Myoview 03/24/12 was non ischemic.  [name] DOE (dyspnea on exertion)   . Elevated cholesterol    Mild, statin intolerant.  . Hyperlipemia    Statin intolerant.  .  Hypertension   . Leg pain   . Lower extremity edema    Late in the evening  . MI (myocardial infarction) ([place])    Prior to CABG 06/01/1996  . Mild aortic regurgitation    With aortic sclerosis  . Nocturia    Of 0-1  . Palpitations    Occasional  . Postoperative anemia 06/01/96   Past Surgical History:  Procedure Laterality Date  . CARDIAC CATHETERIZATION  06/01/96   60-70% distal (L) main, totally occluded LAD prior to the take off of the (L) of the diagonal w/thrombus present, multiple 60% lesions in the circumflex, & a totally occluded (R). Had a good collateral support to the distal RCA & distal LAD distribution but did have significant LV dysfunction w/EF of 30% & significant akinesia in the LAD distribution. Renals, distal aorta & iliacs were normal.   . CHOLECYSTECTOMY    . CORONARY ARTERY BYPASS GRAFT  06/04/96   Dr. Cyndia Bent placed LIMA to the LAD, SVG to the DX, SVG to the OM, & SVG to the PDA - distal CFX.  . OTHER SURGICAL HISTORY     Broken arm, had pin placed   Social History   Tobacco Use  . Smoking status: Former Smoker    Last attempt to quit: 12/09/1972    Years since quitting: 44.9  . Smokeless tobacco: Never Used  Substance Use Topics  . Alcohol use: Yes    Alcohol/week: 1.2 oz    Types: 2 Cans of beer per  week    Comment: Not often   family history includes Heart disease in his mother; Hypertension in his father.  ROS as above:  Medications: Current Outpatient Medications  Medication Sig Dispense Refill  . azelastine (ASTELIN) 0.1 % nasal spray Place 2 sprays into both nostrils 2 (two) times daily. Use in each nostril as directed 30 mL 12  . Coenzyme Q10 (CO Q 10 PO) Take 1 capsule by mouth daily.    . diazepam (VALIUM) 5 MG tablet Take 1 tablet (5 mg total) by mouth at bedtime as needed. 30 tablet 5  . fluticasone (FLONASE) 50 MCG/ACT nasal spray USE 1 OR 2 SPRAYS EACH NOSTRIL TWICE A DAY 16 g 0  . fosinopril (MONOPRIL) 10 MG tablet Take 1 tablet (10 mg  total) by mouth daily. 90 tablet 1  . metoprolol succinate (TOPROL-XL) 25 MG 24 hr tablet Take 0.5 tablets (12.5 mg total) by mouth daily. 45 tablet 2  . Multiple Vitamin (MULTIVITAMIN) capsule Take 1 capsule by mouth daily.    [name] neomycin-polymyxin-hydrocortisone (CORTISPORIN) OTIC solution Place 3 drops into the left ear 4 (four) times daily. 10 mL 0  . ranitidine (ZANTAC) 300 MG tablet Take 1 tablet (300 mg total) by mouth 2 (two) times daily. 180 tablet 3  . predniSONE (DELTASONE) 10 MG tablet Take 3 tablets (30 mg total) daily with breakfast by mouth. 15 tablet 0   No current facility-administered medications for this visit.    Allergies  Allergen Reactions  . Amoxicillin Anaphylaxis  . Penicillins Anaphylaxis  . Asa [Aspirin] Other (See Comments)    GI upset  . Lovaza [Omega-3-Acid Ethyl Esters]   . Singulair [Montelukast Sodium]     Unknown side effects  . Statins     Liver inflammation and joint pain.  Iver Nestle [Fenofibrate] Other (See Comments)    Joint swelling    Health Maintenance Health Maintenance  Topic Date Due  . TETANUS/TDAP  09/18/2026  . INFLUENZA VACCINE  Completed  . PNA vac Low Risk Adult  Completed     Exam:  BP (!) 139/59   Pulse 62   Temp 98.7 F (37.1 C) (Oral)   Resp 16   Ht 5\' 8"  (1.727 m)   Wt 178 lb 1.9 oz (80.8 kg)   SpO2 98%   BMI 27.08 kg/m  Gen: Well NAD HEENT: EOMI,  MMM posterior pharynx is difficult to see. Uvula appears to be centered and normal sized. No exudate. Slightly erythematous. Minimal cervical lymphadenopathy. Normal nasal turbinates. Nontender sinuses.  Lungs: Normal work of breathing. CTABL Heart: RRR no MRG Abd: NABS, Soft. Nondistended, Nontender Exts: Brisk capillary refill, warm and well perfused.   MSK: C-spine nontender to midline normal neck motion. Left shoulder normal-appearing nontender. Normal range of motion but painful abduction arc. Positive Hawkins and Neer's test. Positive empty can  test. Negative Yergason's and speeds test. Strength is diminished abduction external and internal rotation compared to the contralateral right side. 4+/5  No results found for this or any previous visit (from the past 72 hour(s)). No results found.    Assessment and Plan: 83 y.o. male with  Posterior pharyngeal pain is likely pharyngitis likely viral. Discussed treatment options. He's failing conservative management. Plan to proceed with prednisone. This will also likely help his shoulder pain.  Left shoulder pain is likely rotator cuff tendinopathy. Plan for home physical therapy exercises aimed at rotator cuff strengthening and rehabilitation. We'll also treat with steroids. We'll use oral prednisone for viral  pharyngitis. This will likely help shoulder pain.  Recheck in about 4 weeks.   No orders of the defined types were placed in this encounter.  Meds ordered this encounter  Medications  . predniSONE (DELTASONE) 10 MG tablet    Sig: Take 3 tablets (30 mg total) daily with breakfast by mouth.    Dispense:  15 tablet    Refill:  0     Discussed warning signs or symptoms. Please see discharge instructions. Patient expresses understanding.

## 2017-10-27 NOTE — Patient Instructions (Signed)
Thank you for coming in today. Take the prednsone for both the throat and the shoulder.  Do the shoulder exercises  Up to the front.  Up to the side Internal rotation External rotation.   Do 30 reps 2x daily  Recheck in 4 weeks.  Return sooner if needed.    Shoulder Impingement Syndrome Shoulder impingement syndrome is a condition that causes pain when connective tissues (tendons) surrounding the shoulder joint become pinched. These tendons are part of the group of muscles and tissues that help to stabilize the shoulder (rotator cuff). Beneath the rotator cuff is a fluid-filled sac (bursa) that allows the muscles and tendons to glide smoothly. The bursa may become swollen or irritated (bursitis). Bursitis, swelling in the rotator cuff tendons, or both conditions can decrease how much space is under a bone in the shoulder joint (acromion), resulting in impingement. What are the causes? Shoulder impingement syndrome can be caused by bursitis or swelling of the rotator cuff tendons, which may result from:  Repetitive overhead arm movements.  Falling onto the shoulder.  Weakness in the shoulder muscles.  What increases the risk? You may be more likely to develop this condition if you are an athlete who participates in:  Sports that involve throwing, such as baseball.  Tennis.  Swimming.  Volleyball.  Some people are also more likely to develop impingement syndrome because of the shape of their acromion bone. What are the signs or symptoms? The main symptom of this condition is pain on the front or side of the shoulder. Pain may:  Get worse when lifting or raising the arm.  Get worse at night.  Wake you up from sleeping.  Feel sharp when the shoulder is moved, and then fade to an ache.  Other signs and symptoms may include:  Tenderness.  Stiffness.  Inability to raise the arm above shoulder level or behind the body.  Weakness.  How is this diagnosed? This  condition may be diagnosed based on:  Your symptoms.  Your medical history.  A physical exam.  Imaging tests, such as: ? X-rays. ? MRI. ? Ultrasound.  How is this treated? Treatment for this condition may include:  Resting your shoulder and avoiding all activities that cause pain or put stress on the shoulder.  Icing your shoulder.  NSAIDs to help reduce pain and swelling.  One or more injections of medicines to numb the area and reduce inflammation.  Physical therapy.  Surgery. This may be needed if nonsurgical treatments have not helped. Surgery may involve repairing the rotator cuff, reshaping the acromion, or removing the bursa.  Follow these instructions at home: Managing pain, stiffness, and swelling  If directed, apply ice to the injured area. ? Put ice in a plastic bag. ? Place a towel between your skin and the bag. ? Leave the ice on for 20 minutes, 2-3 times a day. Activity  Rest and return to your normal activities as told by your health care provider. Ask your health care provider what activities are safe for you.  Do exercises as told by your health care provider. General instructions  Do not use any tobacco products, including cigarettes, chewing tobacco, or e-cigarettes. Tobacco can delay healing. If you need help quitting, ask your health care provider.  Ask your health care provider when it is safe for you to drive.  Take over-the-counter and prescription medicines only as told by your health care provider.  Keep all follow-up visits as told by your health care provider.  This is important. How is this prevented?  Give your body time to rest between periods of activity.  Be safe and responsible while being active to avoid falls.  Maintain physical fitness, including strength and flexibility. Contact a health care provider if:  Your symptoms have not improved after 1-2 months of treatment and rest.  You cannot lift your arm away from your  body. This information is not intended to replace advice given to you by your health care provider. Make sure you discuss any questions you have with your health care provider. Document Released: 11/25/2005 Document Revised: 08/01/2016 Document Reviewed: 10/28/2015 Elsevier Interactive Patient Education  2018 [place].  Pharyngitis Pharyngitis is a sore throat (pharynx). There is redness, pain, and swelling of your throat. Follow these instructions at home:  Drink enough fluids to keep your pee (urine) clear or pale yellow.  Only take medicine as told by your doctor. ? You may get sick again if you do not take medicine as told. Finish your medicines, even if you start to feel better. ? Do not take aspirin.  Rest.  Rinse your mouth (gargle) with salt water ( tsp of salt per 1 qt of water) every 1-2 hours. This will help the pain.  If you are not at risk for choking, you can suck on hard candy or sore throat lozenges. Contact a doctor if:  You have large, tender lumps on your neck.  You have a rash.  You cough up green, yellow-brown, or bloody spit. Get help right away if:  You have a stiff neck.  You drool or cannot swallow liquids.  You throw up (vomit) or are not able to keep medicine or liquids down.  You have very bad pain that does not go away with medicine.  You have problems breathing (not from a stuffy nose). This information is not intended to replace advice given to you by your health care provider. Make sure you discuss any questions you have with your health care provider. Document Released: 05/13/2008 Document Revised: 05/02/2016 Document Reviewed: 08/02/2013 Elsevier Interactive Patient Education  2017 [employer].

## 2017-11-24 ENCOUNTER — Other Ambulatory Visit: Payer: Self-pay

## 2017-11-24 ENCOUNTER — Telehealth: Payer: Self-pay | Admitting: Family Medicine

## 2017-11-24 DIAGNOSIS — J309 Allergic rhinitis, unspecified: Secondary | ICD-10-CM

## 2017-11-24 MED ORDER — RANITIDINE HCL 300 MG PO TABS: 300.0000 mg | ORAL_TABLET | Freq: Two times a day (BID) | ORAL | 3 refills | Status: DC

## 2017-11-24 NOTE — Telephone Encounter (Signed)
Pt called. He has not gotten refill on azelastine - discussed in last visit.

## 2017-11-25 MED ORDER — AZELASTINE HCL 0.1 % NA SOLN: 2.0000 | Freq: Two times a day (BID) | NASAL | 12 refills | Status: DC

## 2017-11-25 NOTE — Telephone Encounter (Signed)
Rx has been sent to pharmacy. Yacine Garriga,CMA

## 2018-01-13 ENCOUNTER — Encounter: Payer: Self-pay | Admitting: Family Medicine

## 2018-01-13 ENCOUNTER — Ambulatory Visit (INDEPENDENT_AMBULATORY_CARE_PROVIDER_SITE_OTHER): Payer: Medicare Other

## 2018-01-13 ENCOUNTER — Ambulatory Visit (INDEPENDENT_AMBULATORY_CARE_PROVIDER_SITE_OTHER): Payer: Medicare Other | Admitting: Family Medicine

## 2018-01-13 DIAGNOSIS — S81042A Puncture wound with foreign body, left knee, initial encounter: Secondary | ICD-10-CM | POA: Diagnosis not present

## 2018-01-13 DIAGNOSIS — Z1889 Other specified retained foreign body fragments: Secondary | ICD-10-CM | POA: Diagnosis not present

## 2018-01-13 DIAGNOSIS — W3400XA Accidental discharge from unspecified firearms or gun, initial encounter: Secondary | ICD-10-CM | POA: Diagnosis not present
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
## 2018-01-13 NOTE — Patient Instructions (Signed)
Thank you for coming in today. Get xray now.  Resume normal life.  If not doing well we can do physical therapy.  Follow up with Dr Gwenlyn [facility].

## 2018-03-03 ENCOUNTER — Other Ambulatory Visit: Payer: Self-pay | Admitting: Family Medicine

## 2018-03-22 ENCOUNTER — Other Ambulatory Visit: Payer: Self-pay | Admitting: Family Medicine

## 2018-03-23 NOTE — Telephone Encounter (Signed)
[facility] requesting RF on Fluticasone.   Currently on med list but has not been refilled since 2017.  Please advise if RF is appropriate. Thanks!

## 2018-03-28 ENCOUNTER — Other Ambulatory Visit: Payer: Self-pay | Admitting: Family Medicine

## 2018-03-28 DIAGNOSIS — M62838 Other muscle spasm: Secondary | ICD-10-CM

## 2018-04-01 ENCOUNTER — Other Ambulatory Visit: Payer: Self-pay

## 2018-04-01 NOTE — Telephone Encounter (Signed)
[facility] requests a refill on diazepam.

## 2018-04-07 ENCOUNTER — Ambulatory Visit (INDEPENDENT_AMBULATORY_CARE_PROVIDER_SITE_OTHER): Payer: Medicare Other

## 2018-04-07 ENCOUNTER — Ambulatory Visit (INDEPENDENT_AMBULATORY_CARE_PROVIDER_SITE_OTHER): Payer: Medicare Other | Admitting: Family Medicine

## 2018-04-07 ENCOUNTER — Encounter: Payer: Self-pay | Admitting: Family Medicine

## 2018-04-07 VITALS — BP 153/69 | HR 45 | Wt 182.0 lb

## 2018-04-07 DIAGNOSIS — W3400XA Accidental discharge from unspecified firearms or gun, initial encounter: Secondary | ICD-10-CM | POA: Diagnosis not present

## 2018-04-07 DIAGNOSIS — M1711 Unilateral primary osteoarthritis, right knee: Secondary | ICD-10-CM

## 2018-04-07 DIAGNOSIS — N183 Chronic kidney disease, stage 3 unspecified: Secondary | ICD-10-CM

## 2018-04-07 DIAGNOSIS — J309 Allergic rhinitis, unspecified: Secondary | ICD-10-CM

## 2018-04-07 DIAGNOSIS — M25572 Pain in left ankle and joints of left foot: Secondary | ICD-10-CM | POA: Diagnosis not present

## 2018-04-07 MED ORDER — FOSINOPRIL SODIUM 20 MG PO TABS: 20.0000 mg | ORAL_TABLET | Freq: Every day | ORAL | 1 refills | Status: DC

## 2018-04-07 MED ORDER — AZELASTINE HCL 0.1 % NA SOLN: 2.0000 | Freq: Two times a day (BID) | NASAL | 12 refills | Status: DC

## 2018-04-07 MED ORDER — DICLOFENAC SODIUM 1 % TD GEL: 4.0000 g | Freq: Four times a day (QID) | TRANSDERMAL | 11 refills | Status: DC
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
## 2018-04-07 NOTE — Patient Instructions (Addendum)
.  Thank you for coming in today. Get xray today.  Use the diclofenac gel for pain in the knee and ankle 4x daily as needed.  Get labs in about 3 weeks Increase the Fosinopril to 20mg  daily.  Return for recheck in 3 months or sooner if needed.

## 2018-04-21 ENCOUNTER — Ambulatory Visit (INDEPENDENT_AMBULATORY_CARE_PROVIDER_SITE_OTHER): Payer: Medicare Other | Admitting: Family Medicine

## 2018-04-21 VITALS — BP 128/56 | HR 55

## 2018-04-21 DIAGNOSIS — I10 Essential (primary) hypertension: Secondary | ICD-10-CM | POA: Diagnosis not present

## 2018-04-21 LAB — COMPLETE METABOLIC PANEL WITH GFR
AG RATIO: 2.5 (calc) (ref 1.0–2.5)
ALBUMIN MSPROF: 4.8 g/dL (ref 3.6–5.1)
ALT: 9 U/L (ref 9–46)
AST: 16 U/L (ref 10–35)
Alkaline phosphatase (APISO): 44 U/L (ref 40–115)
BILIRUBIN TOTAL: 1.7 mg/dL — AB (ref 0.2–1.2)
BUN / CREAT RATIO: 12 (calc) (ref 6–22)
BUN: 16 mg/dL (ref 7–25)
CHLORIDE: 106 mmol/L (ref 98–110)
CO2: 28 mmol/L (ref 20–32)
Calcium: 9.3 mg/dL (ref 8.6–10.3)
Creat: 1.31 mg/dL — ABNORMAL HIGH (ref 0.70–1.11)
GFR, Est African American: 58 mL/min/{1.73_m2} — ABNORMAL LOW (ref >=60)
GFR, Est Non African American: 50 mL/min/{1.73_m2} — ABNORMAL LOW (ref >=60)
GLOBULIN: 1.9 g/dL (ref 1.9–3.7)
Glucose, Bld: 103 mg/dL — ABNORMAL HIGH (ref 65–99)
POTASSIUM: 4.3 mmol/L (ref 3.5–5.3)
SODIUM: 139 mmol/L (ref 135–146)
Total Protein: 6.7 g/dL (ref 6.1–8.1)

## 2018-04-21 LAB — CBC
HCT: 34.2 % — ABNORMAL LOW (ref 38.5–50.0)
HEMOGLOBIN: 11.5 g/dL — AB (ref 13.2–17.1)
MCH: 29 pg (ref 27.0–33.0)
MCHC: 33.6 g/dL (ref 32.0–36.0)
MCV: 86.1 fL (ref 80.0–100.0)
MPV: 11.1 fL (ref 7.5–12.5)
Platelets: 158 10*3/uL (ref 140–400)
RBC: 3.97 10*6/uL — AB (ref 4.20–5.80)
RDW: 13.9 % (ref 11.0–15.0)
WBC: 9.8 10*3/uL (ref 3.8–10.8)

## 2018-04-21 NOTE — Progress Notes (Signed)
Pt came into clinic today for BP check. At last OV his BP Rx for fosinopril was increased to 20mg  daily. Pt reports taking this every am after breakfast, and tolerating it well. Denies any chest pain or palpations. Pt's BP was at goal today. Advised him to go down and complete labs then follow up with PCP as directed. Verbalized understanding. No further questions.

## 2018-04-21 NOTE — Progress Notes (Signed)
Blood pressure (!) 128/56, pulse (!) 55.  BP at goal. Continue current dose.

## 2018-07-07 ENCOUNTER — Encounter: Payer: Self-pay | Admitting: Family Medicine

## 2018-07-07 ENCOUNTER — Ambulatory Visit (INDEPENDENT_AMBULATORY_CARE_PROVIDER_SITE_OTHER): Payer: Medicare Other

## 2018-07-07 ENCOUNTER — Ambulatory Visit (INDEPENDENT_AMBULATORY_CARE_PROVIDER_SITE_OTHER): Payer: Medicare Other | Admitting: Family Medicine

## 2018-07-07 VITALS — BP 138/65 | HR 49 | Wt 179.0 lb

## 2018-07-07 DIAGNOSIS — G8929 Other chronic pain: Secondary | ICD-10-CM

## 2018-07-07 DIAGNOSIS — M25561 Pain in right knee: Secondary | ICD-10-CM

## 2018-07-07 DIAGNOSIS — M795 Residual foreign body in soft tissue: Secondary | ICD-10-CM

## 2018-07-07 DIAGNOSIS — M25572 Pain in left ankle and joints of left foot: Secondary | ICD-10-CM | POA: Diagnosis not present

## 2018-07-07 DIAGNOSIS — W3400XA Accidental discharge from unspecified firearms or gun, initial encounter: Secondary | ICD-10-CM | POA: Diagnosis not present

## 2018-07-07 DIAGNOSIS — I10 Essential (primary) hypertension: Secondary | ICD-10-CM

## 2018-07-07 MED ORDER — FOSINOPRIL SODIUM 10 MG PO TABS: 10.0000 mg | ORAL_TABLET | Freq: Every day | ORAL | 1 refills | Status: DC
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
## 2018-08-24 ENCOUNTER — Ambulatory Visit (INDEPENDENT_AMBULATORY_CARE_PROVIDER_SITE_OTHER): Payer: Medicare Other | Admitting: Family Medicine

## 2018-08-24 VITALS — BP 133/50 | HR 60 | Ht 69.0 in | Wt 179.0 lb

## 2018-08-24 DIAGNOSIS — M66329 Spontaneous rupture of flexor tendons, unspecified upper arm: Secondary | ICD-10-CM

## 2018-08-24 NOTE — Progress Notes (Signed)
Randall Compton is a 84 y.o. male who presents to [facility] today for arm pain.   This morning when he reached his hand behind his back he felt a pop and had an intense pain in his right arm and shoulder. He localizes the pain in the front of his shoulder and his biceps. Since, he has been very weak and has pain when he turns his palm up. He says "I could barely pick up a cup of tea." he thought at first he may have broken a bone. He says he has not taken anything for the pain.    ROS:  As above  Exam:  BP (!) 133/50   Pulse 60   Ht 5\' 9"  (1.753 m)   Wt 179 lb (81.2 kg)   BMI 26.43 kg/m  General: Well Developed, well nourished, and in no acute distress.  Neuro/Psych: Alert and oriented x3, extra-ocular muscles intact, able to move all 4 extremities, sensation grossly intact. Skin: Warm and dry, no rashes noted.  Respiratory: Not using accessory muscles, speaking in full sentences, trachea midline.  Cardiovascular: Pulses palpable, no extremity edema. Abdomen: Does not appear distended. Right arm: holding arm at side, obvious Popeye deformity.  Tender to palpation in the bicipital groove.  Elbow flexion and supination ROM limited by pain. Elbow extension and pronation intact.  4/5 strength with supination and elbow flexion. 5/5 extension and pronation  Positive Yergason's and Speed's tests  2+ radial pulse. Sensation intact distally    Lab and Radiology Results No results found for this or any previous visit (from the past 72 hour(s)). No results found.   Assessment and Plan: 84 y.o. male with arm and shoulder pain likely due to a proximal biceps tendon rupture. Tenderness to palpation, Popeye deformity, and positive Yergason's and Speed's tests support this diagnosis. He is neurovascularly intact distally. He was reassured that this is a benign injury and will not affect his functionality too much. He was instructed on some home  exercises and stretches that he can do at home and that he should return to baseline in a few weeks. He can take tylenol for pain in the meantime if needed.     No orders of the defined types were placed in this encounter.  No orders of the defined types were placed in this encounter.   Historical information moved to improve visibility of documentation.  Past Medical History:  Diagnosis Date  . Ankle pain   . Aortic sclerosis    With mild aortic regurgitation  . Back pain    Chronic  . BPH (benign prostatic hyperplasia)   . Bradycardia 04/07/13   EKG 04/07/13 showed sinus bradycardia at 54 bpm.  . Broken arm    Had pin placed  . Bronchitis 02/02/2004   Chest CT = Heart vascularity are normal. There is evidence of a previous CABG with some tortuosity of the thoracic aorta. There are no infiltrates or effusions, or other acute abnormalities.  [name] CAD (coronary artery disease)    s/p bypass grafting 06/01/96 with a LIMA to his LAD, a vein to the diagonal branch, a marginal branch, PDA, as well as distal circumflex. ECHO 07/21/96= Normal LV size, wall thickness & global systolic function, EF 49-44%.  Abnormal septal motion compatible with post-operative state w/o major wall motion abnormalities. Aortic sclerosis w/mild aortic regurgitation. Myoview 03/24/12 was non ischemic.  [name] DOE (dyspnea on exertion)   . Elevated cholesterol    Mild,  statin intolerant.  . Hyperlipemia    Statin intolerant.  . Hypertension   . Leg pain   . Lower extremity edema    Late in the evening  . MI (myocardial infarction) ([place])    Prior to CABG 06/01/1996  . Mild aortic regurgitation    With aortic sclerosis  . Nocturia    Of 0-1  . Palpitations    Occasional  . Postoperative anemia 06/01/96   Past Surgical History:  Procedure Laterality Date  . CARDIAC CATHETERIZATION  06/01/96   60-70% distal (L) main, totally occluded LAD prior to the take off of the (L) of the diagonal w/thrombus present, multiple 60%  lesions in the circumflex, & a totally occluded (R). Had a good collateral support to the distal RCA & distal LAD distribution but did have significant LV dysfunction w/EF of 30% & significant akinesia in the LAD distribution. Renals, distal aorta & iliacs were normal.   . CHOLECYSTECTOMY    . CORONARY ARTERY BYPASS GRAFT  06/04/96   Dr. Cyndia Bent placed LIMA to the LAD, SVG to the DX, SVG to the OM, & SVG to the PDA - distal CFX.  . OTHER SURGICAL HISTORY     Broken arm, had pin placed   Social History   Tobacco Use  . Smoking status: Former Smoker    Last attempt to quit: 12/09/1972    Years since quitting: 45.7  . Smokeless tobacco: Never Used  Substance Use Topics  . Alcohol use: Yes    Alcohol/week: 2.0 standard drinks    Types: 2 Cans of beer per week    Comment: Not often   family history includes Heart disease in his mother; Hypertension in his father.  Medications: Current Outpatient Medications  Medication Sig Dispense Refill  . azelastine (ASTELIN) 0.1 % nasal spray Place 2 sprays into both nostrils 2 (two) times daily. Use in each nostril as directed 30 mL 12  . Coenzyme Q10 (CO Q 10 PO) Take 1 capsule by mouth daily.    . diazepam (VALIUM) 5 MG tablet Take 1 tablet (5 mg total) by mouth at bedtime as needed. 30 tablet 4  . diclofenac sodium (VOLTAREN) 1 % GEL Apply 4 g topically 4 (four) times daily. To affected joint. 100 g 11  . fluticasone (FLONASE) 50 MCG/ACT nasal spray USE 1 OR 2 SPRAYS EACH NOSTRIL TWICE A DAY 16 g 0  . fosinopril (MONOPRIL) 10 MG tablet Take 1 tablet (10 mg total) by mouth daily. 90 tablet 1  . metoprolol succinate (TOPROL-XL) 25 MG 24 hr tablet Take 0.5 tablets (12.5 mg total) by mouth daily. 45 tablet 2  . Multiple Vitamin (MULTIVITAMIN) capsule Take 1 capsule by mouth daily.    [name] neomycin-polymyxin-hydrocortisone (CORTISPORIN) OTIC solution Place 3 drops into the left ear 4 (four) times daily. 10 mL 0   No current facility-administered medications  for this visit.    Allergies  Allergen Reactions  . Amoxicillin Anaphylaxis  . Penicillins Anaphylaxis  . Asa [Aspirin] Other (See Comments)    GI upset  . Lovaza [Omega-3-Acid Ethyl Esters]   . Singulair [Montelukast Sodium]     Unknown side effects  . Statins     Liver inflammation and joint pain.  Iver Nestle [Fenofibrate] Other (See Comments)    Joint swelling      Discussed warning signs or symptoms. Please see discharge instructions. Patient expresses understanding.  I personally was present and performed or re-performed the history, physical exam and medical decision-making  activities of this service and have verified that the service and findings are accurately documented in the student's note. ___________________________________________ Lynne Leader M.D., ABFM., CAQSM. Primary Care and Sports Medicine Adjunct Instructor of [employer]

## 2018-08-24 NOTE — Patient Instructions (Signed)
Thank you for coming in today. Keep working on range of motion for the elbow and shoulder    Biceps Tendon Disruption (Proximal) The proximal biceps tendon is a strong cord of tissue that connects the biceps muscle, on the front of the upper arm, to the shoulder blade. A proximal biceps tendon disruption can include a partial or complete tear of the tendon near where it connects to the bone near the shoulder. A proximal biceps tendon disruption can interfere with the ability to lift the arm in front of the body, stabilize the shoulder, bend the elbow, and turn the hand palm-up (supination). What are the causes? A biceps tendon disruption happens when the tendon is exposed to too much force. This excess force may be caused by:  The elbow being suddenly straightened from a bent position because of an external force. This could happen, for example, while catching a heavy weight or being pulled when waterskiing.  Wear and tear from physical activity.  Breaking a fall with your hand.  What increases the risk? The following factors may make you more likely to develop this condition:  Playing contact sports.  Doing activities or sports that involve throwing or overhead movements, such as racket sports, gymnastics, or baseball.  Doing activities or sports that involve putting sudden force on the arm, such as weightlifting or waterskiing.  Having a weakened tendon. The tendon may be weak because of: ? Long-lasting (chronic) biceps tendinitis. ? Certain medical conditions, such as diabetes or rheumatoid arthritis. ? Repeated corticosteroid use. ? Repetitive overhead movements.  What are the signs or symptoms? Symptoms of this condition may include:  Sudden sharp pain in the front of the shoulder. Pain may get worse during certain movements, such as: ? Lifting or carrying objects. ? Straightening the elbow. ? Throwing or using overhead movements.  Inflammation or a feeling of unusual  warmth on the front of the shoulder.  Painful tightening (spasm) of the biceps muscle.  A bulge on the inside of the upper arm when the elbow is bent.  Bruising in the shoulder or upper arm. This may develop 24-48 hours after the tendon is injured.  Limited range of motion of the shoulder and elbow.  Weakness in the elbow and forearm when: ? Bending the elbow. ? Rotating the wrist.  How is this diagnosed? This condition is diagnosed based on your symptoms, your medical history, and a physical exam. Your health care provider may test the strength and range of motion of your shoulder and elbow. You may have imaging tests, such as X-rays, MRI, or ultrasound. How is this treated? This condition is treated by resting and icing the injured area, and by doing physical therapy exercises. Depending on the severity of your condition, treatment may also include:  Medicines to help relieve pain and inflammation.  Avoiding certain activities that put stress on your shoulder.  One or more injections of medicines (corticosteroids) into your upper arm to help reduce inflammation (rare).  Surgery to repair the tear. This may be needed if nonsurgical treatments do not improve your condition.  Follow these instructions at home: Managing pain, stiffness, and swelling  If directed, put ice on the injured area: ? Put ice in a plastic bag. ? Place a towel between your skin and the bag. ? Leave the ice on for 20 minutes, 2-3 times a day.  Move your fingers often to avoid stiffness and to lessen swelling.  Raise (elevate) the injured area while you are sitting  or lying down. Activity  Return to your normal activities as told by your health care provider. Ask your health care provider what activities are safe for you.  Avoid activities that cause pain or make your condition worse.  Do not lift anything that is heavier than 10 lb (4.5 kg) until your health care provider approves.  Do exercises as  told by your health care provider. General instructions  Take over-the-counter and prescription medicines only as told by your health care provider.  Do not drive or operate heavy machinery while taking prescription pain medicines.  Keep all follow-up visits as told by your health care provider. This is important. How is this prevented?  Warm up and stretch before being active.  Cool down and stretch after being active.  Give your body time to rest between periods of activity.  Make sure to use equipment that fits you.  Be safe and responsible while being active to avoid falls.  Maintain physical fitness, including strength and flexibility. Contact a health care provider if:  You have symptoms that get worse or do not get better after 2 weeks of treatment.  You develop new symptoms. Get help right away if:  You have severe pain.  You develop pain or numbness in your hand.  Your hand feels unusually cold.  Your fingernails turn a dark color, such as blue or gray. This information is not intended to replace advice given to you by your health care provider. Make sure you discuss any questions you have with your health care provider. Document Released: 11/25/2005 Document Revised: 08/01/2016 Document Reviewed: 11/03/2015 Elsevier Interactive Patient Education  2018 [employer].    Biceps Tendon Disruption (Proximal) Rehab Ask your health care provider which exercises are safe for you. Do exercises exactly as told by your health care provider and adjust them as directed. It is normal to feel mild stretching, pulling, tightness, or discomfort as you do these exercises, but you should stop right away if you feel sudden pain or your pain gets worse.Do not begin these exercises until told by your health care provider. Stretching and range of motion exercises These exercises warm up your muscles and joints and improve the movement and flexibility of your arm and shoulder. These  exercises also help to relieve pain and stiffness. Exercise A: Shoulder flexion, standing  1. Stand facing a wall. Put your left / right hand on the wall. 2. Slide your left / right hand up the wall. Stop when you feel a stretch in your shoulder, or when you reach the angle recommended by your health care provider. ? Use your other hand to help raise your arm, if needed. ? As your hand gets higher, you may need to step closer to the wall. ? Avoid shrugging your shoulder while you raise your arm. To do this, keep your shoulder blade tucked down toward your spine. 3. Hold for __________ seconds. 4. Slowly return to the starting position. Use your other arm to help, if needed. Repeat __________ times. Complete this exercise __________ times a day. Exercise B: Pendulum  1. Stand near a wall or a surface that you can hold onto for balance. 2. Bend at the waist and let your left / right arm hang straight down. Use your other arm to support you. 3. Relax your arm and shoulder muscles, and move your hips and your trunk so your left / right arm swings freely. Your arm should swing because of the motion of your body, not  because you are using your arm or shoulder muscles. 4. Keep moving so your arm swings in the following directions, as told by your health care provider: ? Side to side. ? Forward and backward. ? In clockwise and counterclockwise circles. 5. Slowly return to the starting position. Repeat __________ times. Complete this exercise __________ times a day. Strengthening exercises These exercises build strength and endurance in your arm and shoulder. Endurance is the ability to use your muscles for a long time, even after your muscles get tired. Exercise C: Elbow flexion, neutral 1. Sit on a stable chair without armrests, or stand. 2. Hold a __________ weight in your left / right hand, or hold an exercise band with both hands. Your palms should face each other at the starting  position. 3. Bend your left / right elbow and move your hand up toward your shoulder. ? Lead with your thumb, and keep your palm facing the same direction. ? Keep your other arm straight down, in the starting position. 4. Slowly return to the starting position. Repeat __________ times. Complete this exercise __________ times a day. Exercise D: Forearm supination  1. Sit with your left / right forearm on a table. Your elbow should be below shoulder height. Rest your hand over the edge of the table so your palm faces down. 2. If directed, hold a hammer with your left / right hand. 3. Without moving your elbow, slowly rotate your hand so your palm faces up toward the ceiling. ? If you are holding a hammer, begin by holding the hammer near the head. When this exercise gets easier for you, hold the hammer farther down the handle. 4. Hold for __________ seconds. 5. Slowly return to the starting position. Repeat __________ times. Complete this exercise __________ times a day. Exercise E: Scapular retraction  1. Sit in a stable chair without armrests, or stand. 2. Secure an exercise band to a stable object in front of you so the band is at shoulder height. 3. Hold one end of the exercise band in each hand. 4. Squeeze your shoulder blades together and move your elbows slightly behind you. Do not shrug your shoulders. 5. Hold for __________ seconds. 6. Slowly return to the starting position. Repeat __________ times. Complete this exercise __________ times a day. Exercise F: Scapular protraction, supine  1. Lie on your back on a firm surface. Hold a __________ weight in your left / right hand. 2. Raise your left / right arm straight into the air so your hand is directly above your shoulder joint. 3. Push the weight into the air so your shoulder lifts off of the surface that you are lying on. Do not move your head, neck, or back. 4. Hold for __________ seconds. 5. Slowly return to the starting  position. Let your muscles relax completely before you repeat this exercise. Repeat __________ times. Complete this exercise __________ times a day. This information is not intended to replace advice given to you by your health care provider. Make sure you discuss any questions you have with your health care provider. Document Released: 11/25/2005 Document Revised: 08/01/2016 Document Reviewed: 11/03/2015 Elsevier Interactive Patient Education  [employer].

## 2018-09-11 ENCOUNTER — Other Ambulatory Visit: Payer: Self-pay | Admitting: Family Medicine

## 2018-09-11 DIAGNOSIS — I10 Essential (primary) hypertension: Secondary | ICD-10-CM

## 2018-09-15 ENCOUNTER — Other Ambulatory Visit: Payer: Self-pay | Admitting: Family Medicine

## 2018-09-15 DIAGNOSIS — M62838 Other muscle spasm: Secondary | ICD-10-CM

## 2018-10-02 ENCOUNTER — Other Ambulatory Visit: Payer: Self-pay | Admitting: Family Medicine

## 2018-10-22 ENCOUNTER — Ambulatory Visit: Payer: Medicare Other | Admitting: Family Medicine

## 2018-11-10 ENCOUNTER — Other Ambulatory Visit: Payer: Self-pay | Admitting: Family Medicine

## 2018-12-10 ENCOUNTER — Ambulatory Visit (INDEPENDENT_AMBULATORY_CARE_PROVIDER_SITE_OTHER): Payer: Medicare Other | Admitting: Family Medicine

## 2018-12-10 ENCOUNTER — Encounter: Payer: Self-pay | Admitting: Family Medicine

## 2018-12-10 VITALS — BP 126/48 | HR 68 | Temp 98.8°F | Wt 177.0 lb

## 2018-12-10 DIAGNOSIS — K047 Periapical abscess without sinus: Secondary | ICD-10-CM | POA: Diagnosis not present

## 2018-12-10 MED ORDER — CLINDAMYCIN HCL 300 MG PO CAPS: 300.0000 mg | ORAL_CAPSULE | Freq: Three times a day (TID) | ORAL | 0 refills | Status: DC

## 2018-12-10 NOTE — Patient Instructions (Addendum)
Thank you for coming in today. Start clindamycin today.  Let me know if not doing well.  Please call and see a dentist.  Recheck with me in the near future.    Dental Abscess  A dental abscess is a collection of pus in or around a tooth that results from an infection. An abscess can cause pain in the affected area as well as other symptoms. Treatment is important to help with symptoms and to prevent the infection from spreading. What are the causes? This condition is caused by a bacterial infection around the root of the tooth that involves the inner part of the tooth (pulp). It may result from:  Severe tooth decay.  Trauma to the tooth, such as a broken or chipped tooth, that allows bacteria to enter into the pulp.  Severe gum disease around a tooth. What increases the risk? This condition is more likely to develop in males. It is also more likely to develop in people who:  Have dental decay (cavities).  Eat sugary snacks between meals.  Use tobacco products.  Have diabetes.  Have a weakened disease-fighting system (immune system).  Do not brush and care for their teeth regularly. What are the signs or symptoms? Symptoms of this condition include:  Severe pain in and around the infected tooth.  Swelling and redness around the infected tooth, in the mouth, or in the face.  Tenderness.  Pus drainage.  Bad breath.  Bitter taste in the mouth.  Difficulty swallowing.  Difficulty opening the mouth.  Nausea.  Vomiting.  Chills.  Swollen neck glands.  Fever. How is this diagnosed? This condition is diagnosed based on:  Your symptoms and your medical and dental history.  An examination of the infected tooth. During the exam, your dentist may tap on the infected tooth. You may also have X-rays of the affected area. How is this treated? This condition is treated by getting rid of the infection. This may be done with:  Incision and drainage. This procedure  is done by making an incision in the abscess to drain out the pus. Removing pus is the first priority in treating an abscess.  Antibiotic medicines. These may be used in certain situations.  Antibacterial mouth rinse.  A root canal. This may be performed to save the tooth. Your dentist accesses the visible part of your tooth (crown) with a drill and removes any damaged pulp. Then the space is filled and sealed off.  Tooth extraction. The tooth is pulled out if it cannot be saved by other treatment. You may also receive treatment for pain, such as:  Acetaminophen or NSAIDs.  Gels that contain a numbing medicine.  An injection to block the pain near your nerve. Follow these instructions at home: Medicines  Take over-the-counter and prescription medicines only as told by your dentist.  If you were prescribed an antibiotic, take it as told by your dentist. Do not stop taking the antibiotic even if you start to feel better.  If you were prescribed a gel that contains a numbing medicine, use it exactly as told in the directions. Do not use these gels for children who are younger than 11 years of age.  Do not drive or use heavy machinery while taking prescription pain medicine. General instructions  Rinse out your mouth often with salt water to relieve pain or swelling. To make a salt-water mixture, completely dissolve -1 tsp of salt in 1 cup of warm water.  Eat a soft diet while  your abscess is healing.  Drink enough fluid to keep your urine pale yellow.  Do not apply heat to the outside of your mouth.  Do not use any products that contain nicotine or tobacco, such as cigarettes and e-cigarettes. If you need help quitting, ask your health care provider.  Keep all follow-up visits as told by your dentist. This is important. How is this prevented?  Brush your teeth every morning and night with fluoride toothpaste. Floss one time each day.  Get regularly scheduled dental  cleanings.  Consider having a dental sealant applied on teeth that have deep holes (caries).  Drink fluoridated water regularly. This includes most tap water. Check the label on bottled water to see if it contains fluoride.  Drink water instead of sugary drinks.  Eat healthy meals and snacks.  Wear a mouth guard or face shield to protect your teeth while playing sports. Contact a health care provider if:  Your pain is worse and is not helped by medicine. Get help right away if:  You have a fever or chills.  Your symptoms suddenly get worse.  You have a very bad headache.  You have problems breathing or swallowing.  You have trouble opening your mouth.  You have swelling in your neck or around your eye. Summary  A dental abscess is a collection of pus in or around a tooth that results from an infection.  A dental abscess may result from severe tooth decay, trauma to the tooth, or severe gum disease around a tooth.  Symptoms include severe pain, swelling, redness, and drainage of pus in and around the infected tooth.  The first priority in treating a dental abscess is to drain out the pus. Treatment may also involve removing damage inside the tooth (root canal) or pulling out (extracting) the tooth. This information is not intended to replace advice given to you by your health care provider. Make sure you discuss any questions you have with your health care provider. Document Released: 11/25/2005 Document Revised: 07/28/2017 Document Reviewed: 07/28/2017 Elsevier Interactive Patient Education  2019 [employer].

## 2018-12-11 NOTE — Progress Notes (Signed)
Randall Compton is a 85 y.o. male who presents to [facility]: [place] today for dental pain.  Mr. Kerney notes a broken left lower tooth.  He has multiple missing teeth in his mandible but notes tooth number 22 or 23 broke sometime in the past and recently has become very painful.  He notes some jaw swelling over the last day or 2.  He notes this is quite painful.  He is tried Tylenol which helps.  No fevers or chills vomiting or diarrhea.  He does not currently have a dentist.   ROS as above:  Exam:  BP (!) 126/48   Pulse 68   Temp 98.8 F (37.1 C) (Oral)   Wt 177 lb (80.3 kg)   BMI 26.14 kg/m  Wt Readings from Last 5 Encounters:  12/10/18 177 lb (80.3 kg)  08/24/18 179 lb (81.2 kg)  07/07/18 179 lb (81.2 kg)  04/07/18 182 lb (82.6 kg)  01/13/18 181 lb (82.1 kg)    Gen: Well NAD HEENT: EOMI,  MMM multiple missing teeth however tooth #22 or 23 is broken at the gumline with surrounding erythema and tenderness.  No visible abscess.  Slight left-sided jaw swelling with no discrete palpable fluctuant abscess. Lungs: Normal work of breathing. CTABL Heart: RRR no MRG Abd: NABS, Soft. Nondistended, Nontender Exts: Brisk capillary refill, warm and well perfused.   Lab and Radiology Results No results found for this or any previous visit (from the past 72 hour(s)). No results found.    Assessment and Plan: 85 y.o. male with dental infection.  Plan to treat with clindamycin given penicillin allergy.  Recheck with me in the near future and follow-up with dentist.  PDMP reviewed during this encounter. No orders of the defined types were placed in this encounter.  Meds ordered this encounter  Medications  . clindamycin (CLEOCIN) 300 MG capsule    Sig: Take 1 capsule (300 mg total) by mouth 3 (three) times daily.    Dispense:  21 capsule    Refill:  0     Historical  information moved to improve visibility of documentation.  Past Medical History:  Diagnosis Date  . Ankle pain   . Aortic sclerosis    With mild aortic regurgitation  . Back pain    Chronic  . BPH (benign prostatic hyperplasia)   . Bradycardia 04/07/13   EKG 04/07/13 showed sinus bradycardia at 54 bpm.  . Broken arm    Had pin placed  . Bronchitis 02/02/2004   Chest CT = Heart vascularity are normal. There is evidence of a previous CABG with some tortuosity of the thoracic aorta. There are no infiltrates or effusions, or other acute abnormalities.  [name] CAD (coronary artery disease)    s/p bypass grafting 06/01/96 with a LIMA to his LAD, a vein to the diagonal branch, a marginal branch, PDA, as well as distal circumflex. ECHO 07/21/96= Normal LV size, wall thickness & global systolic function, EF 44-01%.  Abnormal septal motion compatible with post-operative state w/o major wall motion abnormalities. Aortic sclerosis w/mild aortic regurgitation. Myoview 03/24/12 was non ischemic.  [name] DOE (dyspnea on exertion)   . Elevated cholesterol    Mild, statin intolerant.  . Hyperlipemia    Statin intolerant.  . Hypertension   . Leg pain   . Lower extremity edema    Late in the evening  . MI (myocardial infarction) ([place])    Prior to CABG  06/01/1996  . Mild aortic regurgitation    With aortic sclerosis  . Nocturia    Of 0-1  . Palpitations    Occasional  . Postoperative anemia 06/01/96   Past Surgical History:  Procedure Laterality Date  . CARDIAC CATHETERIZATION  06/01/96   60-70% distal (L) main, totally occluded LAD prior to the take off of the (L) of the diagonal w/thrombus present, multiple 60% lesions in the circumflex, & a totally occluded (R). Had a good collateral support to the distal RCA & distal LAD distribution but did have significant LV dysfunction w/EF of 30% & significant akinesia in the LAD distribution. Renals, distal aorta & iliacs were normal.   . CHOLECYSTECTOMY    . CORONARY  ARTERY BYPASS GRAFT  06/04/96   Dr. Cyndia Bent placed LIMA to the LAD, SVG to the DX, SVG to the OM, & SVG to the PDA - distal CFX.  . OTHER SURGICAL HISTORY     Broken arm, had pin placed   Social History   Tobacco Use  . Smoking status: Former Smoker    Last attempt to quit: 12/09/1972    Years since quitting: 46.0  . Smokeless tobacco: Never Used  Substance Use Topics  . Alcohol use: Yes    Alcohol/week: 2.0 standard drinks    Types: 2 Cans of beer per week    Comment: Not often   family history includes Heart disease in his mother; Hypertension in his father.  Medications: Current Outpatient Medications  Medication Sig Dispense Refill  . azelastine (ASTELIN) 0.1 % nasal spray Place 2 sprays into both nostrils 2 (two) times daily. Use in each nostril as directed 30 mL 12  . Coenzyme Q10 (CO Q 10 PO) Take 1 capsule by mouth daily.    . diazepam (VALIUM) 5 MG tablet TAKE 1 TABLET (5 MG TOTAL) BY MOUTH AT BEDTIME AS NEEDED. 30 tablet 4  . diclofenac sodium (VOLTAREN) 1 % GEL Apply 4 g topically 4 (four) times daily. To affected joint. 100 g 11  . fluticasone (FLONASE) 50 MCG/ACT nasal spray USE 1 OR 2 SPRAYS EACH NOSTRIL TWICE A DAY 16 g 0  . fosinopril (MONOPRIL) 10 MG tablet Take 1 tablet (10 mg total) by mouth daily. 90 tablet 1  . fosinopril (MONOPRIL) 20 MG tablet TAKE 1 TABLET BY MOUTH EVERY DAY 90 tablet 1  . metoprolol succinate (TOPROL-XL) 25 MG 24 hr tablet TAKE 1/2 TABLET DAILY 45 tablet 2  . Multiple Vitamin (MULTIVITAMIN) capsule Take 1 capsule by mouth daily.    [name] neomycin-polymyxin-hydrocortisone (CORTISPORIN) OTIC solution Place 3 drops into the left ear 4 (four) times daily. 10 mL 0  . ranitidine (ZANTAC) 300 MG tablet TAKE 1 TABLET (300 MG TOTAL) BY MOUTH 2 (TWO) TIMES DAILY. 180 tablet 3  . clindamycin (CLEOCIN) 300 MG capsule Take 1 capsule (300 mg total) by mouth 3 (three) times daily. 21 capsule 0   No current facility-administered medications for this visit.     Allergies  Allergen Reactions  . Amoxicillin Anaphylaxis  . Penicillins Anaphylaxis  . Asa [Aspirin] Other (See Comments)    GI upset  . Lovaza [Omega-3-Acid Ethyl Esters]   . Singulair [Montelukast Sodium]     Unknown side effects  . Statins     Liver inflammation and joint pain.  Iver Nestle [Fenofibrate] Other (See Comments)    Joint swelling     Discussed warning signs or symptoms. Please see discharge instructions. Patient expresses understanding.

## 2018-12-31 ENCOUNTER — Other Ambulatory Visit: Payer: Self-pay | Admitting: Family Medicine

## 2019-01-09 ENCOUNTER — Encounter: Payer: Self-pay | Admitting: Emergency Medicine

## 2019-01-09 ENCOUNTER — Other Ambulatory Visit: Payer: Self-pay

## 2019-01-09 ENCOUNTER — Emergency Department
Admission: EM | Admit: 2019-01-09 | Discharge: 2019-01-09 | Disposition: A | Discharge status: Home / Self Care | Payer: Medicare Other | Source: Home / Self Care

## 2019-01-09 DIAGNOSIS — B9789 Other viral agents as the cause of diseases classified elsewhere: Secondary | ICD-10-CM | POA: Diagnosis not present

## 2019-01-09 DIAGNOSIS — J069 Acute upper respiratory infection, unspecified: Secondary | ICD-10-CM

## 2019-01-09 MED ORDER — HYDROCODONE-HOMATROPINE 5-1.5 MG/5ML PO SYRP: 5.0000 mL | ORAL_SOLUTION | Freq: Four times a day (QID) | ORAL | 0 refills | Status: DC | PRN

## 2019-01-09 MED ORDER — PREDNISONE 20 MG PO TABS: ORAL_TABLET | ORAL | 1 refills | Status: DC
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
## 2019-01-09 NOTE — ED Triage Notes (Signed)
Patient states yesterday he had laryngitis which evolved into a cough and low grade fever; he took tylenol at 1530 today.

## 2019-01-16 ENCOUNTER — Other Ambulatory Visit: Payer: Self-pay

## 2019-01-16 ENCOUNTER — Encounter: Payer: Self-pay | Admitting: Emergency Medicine

## 2019-01-16 ENCOUNTER — Emergency Department
Admission: EM | Admit: 2019-01-16 | Discharge: 2019-01-16 | Disposition: A | Discharge status: Home / Self Care | Payer: Medicare Other | Source: Home / Self Care

## 2019-01-16 DIAGNOSIS — J069 Acute upper respiratory infection, unspecified: Secondary | ICD-10-CM | POA: Diagnosis not present

## 2019-01-16 MED ORDER — AZITHROMYCIN 250 MG PO TABS: 250.0000 mg | ORAL_TABLET | Freq: Every day | ORAL | 0 refills | Status: DC

## 2019-01-16 NOTE — ED Triage Notes (Signed)
Returns today with worsening cough; SOB from coughing. Completed prescribed Prednisone; sx's returned congestion, rattling in chest and runny nose. Taking Robitussin

## 2019-01-16 NOTE — Discharge Instructions (Signed)
°  Please take antibiotics as prescribed and be sure to complete entire course even if you start to feel better to ensure infection does not come back.  Call to schedule a follow up exam with your primary care provider for recheck of symptoms in 1 week if not improving.
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
## 2019-02-27 ENCOUNTER — Other Ambulatory Visit: Payer: Self-pay | Admitting: Family Medicine

## 2019-02-27 DIAGNOSIS — M62838 Other muscle spasm: Secondary | ICD-10-CM

## 2019-03-28 ENCOUNTER — Other Ambulatory Visit: Payer: Self-pay | Admitting: Family Medicine

## 2019-04-15 ENCOUNTER — Telehealth: Payer: Self-pay | Admitting: Family Medicine

## 2019-04-15 NOTE — Telephone Encounter (Signed)
Called pt, no ans and no VM picked up

## 2019-04-15 NOTE — Telephone Encounter (Signed)
Spoke with patient and advised placard ready for pick up.

## 2019-04-15 NOTE — Telephone Encounter (Signed)
Received application form for handicap parking placard.  Agreed.  Form completed and ready for pickup.

## 2019-04-22 ENCOUNTER — Other Ambulatory Visit: Payer: Self-pay | Admitting: Family Medicine

## 2019-05-11 ENCOUNTER — Other Ambulatory Visit: Payer: Self-pay | Admitting: Family Medicine

## 2019-05-11 DIAGNOSIS — J309 Allergic rhinitis, unspecified: Secondary | ICD-10-CM

## 2019-05-21 ENCOUNTER — Other Ambulatory Visit: Payer: Self-pay | Admitting: Family Medicine

## 2019-06-09 ENCOUNTER — Other Ambulatory Visit: Payer: Self-pay | Admitting: Family Medicine

## 2019-06-09 DIAGNOSIS — I10 Essential (primary) hypertension: Secondary | ICD-10-CM

## 2019-07-01 ENCOUNTER — Other Ambulatory Visit: Payer: Self-pay | Admitting: Family Medicine

## 2019-07-01 DIAGNOSIS — I10 Essential (primary) hypertension: Secondary | ICD-10-CM

## 2019-07-08 ENCOUNTER — Ambulatory Visit (INDEPENDENT_AMBULATORY_CARE_PROVIDER_SITE_OTHER): Payer: Medicare Other | Admitting: Family Medicine

## 2019-07-08 ENCOUNTER — Other Ambulatory Visit: Payer: Self-pay

## 2019-07-08 ENCOUNTER — Encounter: Payer: Self-pay | Admitting: Family Medicine

## 2019-07-08 VITALS — BP 132/48 | HR 68 | Ht 69.0 in | Wt 173.0 lb

## 2019-07-08 DIAGNOSIS — R221 Localized swelling, mass and lump, neck: Secondary | ICD-10-CM

## 2019-07-08 MED ORDER — DOXYCYCLINE HYCLATE 100 MG PO TABS: 100.0000 mg | ORAL_TABLET | Freq: Two times a day (BID) | ORAL | 0 refills | Status: DC
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
## 2019-07-13 ENCOUNTER — Other Ambulatory Visit: Payer: Self-pay | Admitting: Family Medicine

## 2019-07-25 ENCOUNTER — Other Ambulatory Visit: Payer: Self-pay | Admitting: Family Medicine

## 2019-07-25 DIAGNOSIS — I10 Essential (primary) hypertension: Secondary | ICD-10-CM

## 2019-08-03 ENCOUNTER — Other Ambulatory Visit: Payer: Self-pay | Admitting: Family Medicine

## 2019-08-03 DIAGNOSIS — J309 Allergic rhinitis, unspecified: Secondary | ICD-10-CM

## 2019-08-06 ENCOUNTER — Other Ambulatory Visit: Payer: Self-pay | Admitting: Family Medicine

## 2019-08-18 ENCOUNTER — Other Ambulatory Visit: Payer: Self-pay | Admitting: Family Medicine

## 2019-08-18 DIAGNOSIS — I10 Essential (primary) hypertension: Secondary | ICD-10-CM

## 2019-09-05 ENCOUNTER — Other Ambulatory Visit: Payer: Self-pay | Admitting: Family Medicine

## 2019-09-05 DIAGNOSIS — J309 Allergic rhinitis, unspecified: Secondary | ICD-10-CM

## 2019-09-12 ENCOUNTER — Other Ambulatory Visit: Payer: Self-pay | Admitting: Family Medicine

## 2019-09-12 DIAGNOSIS — I10 Essential (primary) hypertension: Secondary | ICD-10-CM

## 2019-09-12 DIAGNOSIS — M62838 Other muscle spasm: Secondary | ICD-10-CM

## 2019-10-03 ENCOUNTER — Other Ambulatory Visit: Payer: Self-pay | Admitting: Family Medicine

## 2019-10-03 DIAGNOSIS — J309 Allergic rhinitis, unspecified: Secondary | ICD-10-CM

## 2019-10-28 ENCOUNTER — Other Ambulatory Visit: Payer: Self-pay | Admitting: Family Medicine

## 2019-10-28 DIAGNOSIS — J309 Allergic rhinitis, unspecified: Secondary | ICD-10-CM

## 2019-11-03 ENCOUNTER — Other Ambulatory Visit: Payer: Self-pay | Admitting: Family Medicine

## 2019-11-03 DIAGNOSIS — M62838 Other muscle spasm: Secondary | ICD-10-CM

## 2019-11-22 ENCOUNTER — Other Ambulatory Visit: Payer: Self-pay | Admitting: Sports Medicine

## 2019-11-22 DIAGNOSIS — J309 Allergic rhinitis, unspecified: Secondary | ICD-10-CM

## 2019-11-22 NOTE — Telephone Encounter (Signed)
Refills are on this and sent 3 weeks ago.

## 2019-11-22 NOTE — Telephone Encounter (Signed)
To covering provider

## 2019-12-18 ENCOUNTER — Other Ambulatory Visit: Payer: Self-pay | Admitting: Sports Medicine

## 2020-01-05 ENCOUNTER — Other Ambulatory Visit: Payer: Self-pay | Admitting: Family Medicine

## 2020-01-05 ENCOUNTER — Other Ambulatory Visit: Payer: Self-pay | Admitting: Sports Medicine

## 2020-01-05 DIAGNOSIS — M62838 Other muscle spasm: Secondary | ICD-10-CM

## 2020-01-05 DIAGNOSIS — I10 Essential (primary) hypertension: Secondary | ICD-10-CM

## 2020-01-14 ENCOUNTER — Telehealth (INDEPENDENT_AMBULATORY_CARE_PROVIDER_SITE_OTHER): Payer: Medicare Other | Admitting: Medical-Surgical

## 2020-01-14 ENCOUNTER — Encounter: Payer: Self-pay | Admitting: Medical-Surgical

## 2020-01-14 VITALS — Temp 98.1°F

## 2020-01-14 DIAGNOSIS — J321 Chronic frontal sinusitis: Secondary | ICD-10-CM | POA: Diagnosis not present

## 2020-01-14 DIAGNOSIS — G4700 Insomnia, unspecified: Secondary | ICD-10-CM

## 2020-01-14 DIAGNOSIS — J309 Allergic rhinitis, unspecified: Secondary | ICD-10-CM | POA: Insufficient documentation

## 2020-01-14 MED ORDER — DIAZEPAM 5 MG PO TABS: 5.0000 mg | ORAL_TABLET | Freq: Every day | ORAL | 0 refills | Status: DC
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
## 2020-01-14 NOTE — Assessment & Plan Note (Addendum)
Given sinus symptoms also accompanied by a mild cough and some shortness of breath with exertion, recommend Covid testing.  Continue azelastine nasal spray and oral loratadine.  Reviewed signs/symptoms of developing infection that will require further evaluation.

## 2020-01-14 NOTE — Assessment & Plan Note (Signed)
Continue Valium 5 mg at bedtime as needed.  30-day refill provided, further refills to be handled by Dr. Zigmund Daniel once care established.

## 2020-01-22 ENCOUNTER — Other Ambulatory Visit: Payer: Self-pay | Admitting: Sports Medicine

## 2020-02-01 ENCOUNTER — Other Ambulatory Visit: Payer: Self-pay | Admitting: Sports Medicine

## 2020-02-01 ENCOUNTER — Other Ambulatory Visit: Payer: Self-pay | Admitting: Physician Assistant

## 2020-02-01 DIAGNOSIS — I10 Essential (primary) hypertension: Secondary | ICD-10-CM

## 2020-02-01 DIAGNOSIS — J309 Allergic rhinitis, unspecified: Secondary | ICD-10-CM
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
## 2020-02-21 ENCOUNTER — Encounter: Payer: Medicare Other | Admitting: Family Medicine

## 2020-02-21 ENCOUNTER — Other Ambulatory Visit: Payer: Self-pay | Admitting: Sports Medicine

## 2020-03-16 ENCOUNTER — Other Ambulatory Visit: Payer: Self-pay | Admitting: Family Medicine

## 2020-03-27 ENCOUNTER — Other Ambulatory Visit: Payer: Self-pay | Admitting: Sports Medicine

## 2020-03-27 DIAGNOSIS — J309 Allergic rhinitis, unspecified: Secondary | ICD-10-CM

## 2020-04-01 ENCOUNTER — Other Ambulatory Visit: Payer: Self-pay | Admitting: Family Medicine

## 2020-04-03 NOTE — Telephone Encounter (Signed)
EC-Plz see refill req/thx dmf

## 2020-04-24 ENCOUNTER — Other Ambulatory Visit: Payer: Self-pay

## 2020-04-24 ENCOUNTER — Emergency Department (INDEPENDENT_AMBULATORY_CARE_PROVIDER_SITE_OTHER)
Admission: EM | Admit: 2020-04-24 | Discharge: 2020-04-24 | Disposition: A | Discharge status: Home / Self Care | Payer: Medicare Other | Source: Home / Self Care | Attending: Family Medicine | Admitting: Family Medicine

## 2020-04-24 DIAGNOSIS — J302 Other seasonal allergic rhinitis: Secondary | ICD-10-CM

## 2020-04-24 DIAGNOSIS — H6123 Impacted cerumen, bilateral: Secondary | ICD-10-CM

## 2020-04-24 DIAGNOSIS — K219 Gastro-esophageal reflux disease without esophagitis: Secondary | ICD-10-CM

## 2020-04-24 MED ORDER — PREDNISONE 20 MG PO TABS: ORAL_TABLET | ORAL | 0 refills | Status: DC

## 2020-04-24 NOTE — Discharge Instructions (Addendum)
May continue Claritin for allergies.  Begin Prilosec OTC (omeprazole 20mg ) one tab daily about 20 minutes before a meal.  To prevent recurrent ear wax blockage, try the following: Soak two cotton balls with mineral oil, and gently place in each ear canal once weekly.  Leave the cotton balls in place for 10 to 20 minutes.  This will help liquefy the ear wax and aid your body's normal elimination process.  If applicable, do not use a hearing aid for 8 hours overnight.  Have your ears cleaned by a health professional every 6 to 12 months.  Avoid using "Q-tips" and ear wax softening solutions
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
## 2020-04-24 NOTE — ED Triage Notes (Signed)
Patient presents to Urgent Care with complaints of intermittent sinus congestion that has crept down into his chest since 3-4 months ago. Patient reports usually it gets better by now but it has not improved, states the congestion drains down his throat at night. Has been taking claritin and that has made it better.

## 2020-05-05 ENCOUNTER — Other Ambulatory Visit: Payer: Self-pay | Admitting: Family Medicine

## 2020-05-05 NOTE — Telephone Encounter (Signed)
CM-Plz see refill req/thx dmf 

## 2020-05-09 NOTE — Telephone Encounter (Signed)
Forwarding to PCP.

## 2020-05-17 ENCOUNTER — Telehealth: Payer: Self-pay | Admitting: Medical-Surgical

## 2020-05-17 ENCOUNTER — Other Ambulatory Visit: Payer: Self-pay | Admitting: Physician Assistant

## 2020-05-17 ENCOUNTER — Other Ambulatory Visit: Payer: Self-pay | Admitting: Sports Medicine

## 2020-05-17 DIAGNOSIS — J309 Allergic rhinitis, unspecified: Secondary | ICD-10-CM

## 2020-05-17 DIAGNOSIS — I10 Essential (primary) hypertension: Secondary | ICD-10-CM

## 2020-05-17 NOTE — Telephone Encounter (Signed)
Pt last got the Rx for metoprolol 25 mg filled on 02/01/2020 for 7 tablets with the annotation that he is due for a f/up visit with his PCP which he did not schedule an appt. He had a telephone visit with Joy on 01/14/2020 in which he stated he was going to establish care with Dr. Zigmund Daniel. He scheduled an appt for a CPE with Dr. Zigmund Daniel and then cancelled the appt. He is scheduled for an appt with Joy on 05/18/2020 at 1:00 PM for a Rx refill for metoprolol. I called the pt to find out if he was wanting to establish with Joy as his PCP or with Dr. Zigmund Daniel, because if he was establishing with Joy we would need to make some adjustments to her schedule to allow more time. When I asked him he said that he wanted to establish with Dr. Zigmund Daniel and that he was scheduled for a CPE with him a few months ago but we cancelled the appt. He said he wanted to see Dr. Zigmund Daniel but if we insisted on "bouncing him around" that he would have to do that. He told me to call him back with the next appt time for him to see Dr. Zigmund Daniel, and I told him that I could transfer his call to the front desk and they would be able to give him that information, but I also wanted to discuss the Rx refill with him. He said that he had already established Dr. Zigmund Daniel a few months ago and I told him that appt was a telephone call with Joy and during that appt he said he was establishing with Dr. Zigmund Daniel. He then started using profanity stating that I was making up the appt and it never happened. I opened the office note and read the recommendations and he said that it was not Joy he had the telephone visit with, it was Dr. Zigmund Daniel. I told him if he wanted to establish with Joy it was fine, or if he wanted to see Dr. Zigmund Daniel I could transfer the call to the front desk. I told him that I was also calling about the Rx refill request. I told him that he was overdue for lab work because it had not been done in over 2 years. He started using profanity  again in which I asked him to please stop using profanity or I would have to disconnect the call. He said that he has taken this med for 25 years and that we have never told him he needed labs. I then told him that we would need to do the labs in order to check his kidneys and liver and make sure the meds are not causing damage and that once we had those labs back we would know what direction to head in. He continued to use profanity and stated he was going to call the medical board on the practice. I asked him again to stop using profanity or I would have to disconnect the call. He responded to me using profanity, in which at that time I disconnected the call. This was witnessed by Caryl Asp and [employer] was also at the nurses station during the time of this telephone call. Information was shared with Caryl Asp and Apolonio Schneiders who instructed I forward this information to her and Dr. Madilyn Fireman for further review and possible dismissal from the practice.

## 2020-05-17 NOTE — Telephone Encounter (Signed)
Patient states he is out of heart medicine and needs a refill. Metoprolol

## 2020-05-17 NOTE — Telephone Encounter (Signed)
To PCP

## 2020-05-17 NOTE — Telephone Encounter (Signed)
I called [phone] to speak with Jori Moll and had to leave a message at 12:59pm.  I ask Philo to return my phone call directly at [phone].

## 2020-05-17 NOTE — Telephone Encounter (Signed)
Dr Zigmund Daniel came into triage and discussed this with me, I advised him that call would be made before end of the day.   Abigail Butts was in triage and agreeable that I call and let him know what Dr Zigmund Daniel had said. I confirmed with Abigail Butts that she was agreeable that I call and advise patient that Dr Zigmund Daniel did not feel this would be a beneficial relationship and that he would have 30 days of medication sent to pharmacy and that a letter would be mailed, Abigail Butts agreed.   I placed call to patient in triage with door closed and Abigail Butts present for the conversation. Patient answered when I called and I told him that upon review of today's events, Dr Zigmund Daniel did not think that this was going to be a beneficial patient-provider relationship. Patient was advised that he was going to have 30 days of medication sent to pharmacy to hold him over until he is able to establish care with a new provider. Patient was upset feeling that today's actions did not represent him as a person. I advised patient that this was Dr West Pugh recommendations, then placed patient on hold for Abigail Butts to complete conversation, since she was the one who placed initial call to him earlier today to discuss this situation.   Abigail Butts stated she did not agree with the way this was handled and she felt that patient needed a warning before being discharged. I advised Abigail Butts I was saying what Dr Zigmund Daniel asked and also what she had approved for me to say when making this phone call. Abigail Butts stated she wanted to talk to Dr Zigmund Daniel about this tomorrow because she did not feel discharge was necessary due to patient being polite on the phone at that moment and since she did not see any other instances of patient being rude. I asked Abigail Butts to take over conversation with patient since I felt this was something that she should discuss with patient, especially if she is now not agreeing with providers note. Abigail Butts did take over the call and let patient know she would  contact him tomorrow regarding an appointment with Dr Zigmund Daniel.   Since I feel this is better handled between my direct supervisor and a provider, I am stepping back from this at this time to let them handle this.

## 2020-05-17 NOTE — Telephone Encounter (Signed)
I agree we should proceed with dismissal as actions and behaviors like this will not be tolerated.  I am ok with 30 days of metoprolol to be sent in until he can establish with a new clinic.    Thanks!  CM

## 2020-05-17 NOTE — Telephone Encounter (Signed)
After conversation with Cyril Mourning, patient called back on Dr Zigmund Daniel line and I was able to speak to patient. Patient expressed to me he felt that we were lying to him because virtual visit was noted in February with Samuel Bouche and he was told it was a video. I advised patient this was likely a miscommunication that in our system virtual visits show as video visits, but it was very likely that he did just have a phone call. Patient stated that we needed to "fix our system" and that "if you cant even work your computer system then how can you even provide appropriate care for patients". I apologized to patient that he felt this way.   Patient then stated that he is scheduled with Dr Zigmund Daniel tomorrow to establish care and that he needs refills sent in now. I asked patient how long he had been out of medication since he only received a 14 day supply in February. Patient reports he had a couple extra tablets and that he is out. He states he should have lied at appointment in February so that he could have gotten a 90 day refill, I advised him it is always best to be truthful, and that he still would have received the same refill due to needing to establish with new primary care, which he was advised of multiple times during his appointment with Joy. I told patient that we could send in a short supply to last to his appointment and patient became very angry.   I told patient we would try to work with him, but it was noted he was using profanity with a previous MA and that was not acceptable for this clinic. Patient insisted that he only used "damn" and "hell". I advised him we do consider that profanity and that it was not accepted here. Patient was also told that if this behavior continued that we would have to take further action. Patient states "do not threaten me. I will go somewhere else". I advised patient that if he feels that would be best for his care, he is able to do that. Patient then responded "DO NOT  THREATEN ME. I will take a lawyer with me to the medical board and report your office". Patient then disconnected the call.   Patient currently have a visit to establish care with Samuel Bouche tomorrow, but thinks this appointment is with Dr Zigmund Daniel. I have recorded all interaction with this patient and made my direct supervisor aware. I have asked that she reach out to patient and that I would be more than happy to send 30 day supply of metoprolol to his pharmacy to hold him over until he can find other care.

## 2020-05-18 ENCOUNTER — Ambulatory Visit: Payer: Medicare Other | Admitting: Medical-Surgical

## 2020-05-18 ENCOUNTER — Other Ambulatory Visit: Payer: Self-pay | Admitting: Family Medicine

## 2020-05-18 DIAGNOSIS — I10 Essential (primary) hypertension: Secondary | ICD-10-CM

## 2020-05-18 MED ORDER — METOPROLOL SUCCINATE ER 25 MG PO TB24: ORAL_TABLET | ORAL | 0 refills | Status: DC

## 2020-05-18 NOTE — Telephone Encounter (Signed)
I spoke with Dr. Zigmund Daniel last night over the situation that occurred and we have agreed that he will see the patient.  Dr. Zigmund Daniel will also speak with the patient when he comes in for his appointment and let him know that type of behavior will not be tolerated and if the behavior happens again he will be discharged from the practice.

## 2020-05-31 ENCOUNTER — Other Ambulatory Visit: Payer: Self-pay

## 2020-05-31 ENCOUNTER — Encounter: Payer: Self-pay | Admitting: Family Medicine

## 2020-05-31 ENCOUNTER — Ambulatory Visit (INDEPENDENT_AMBULATORY_CARE_PROVIDER_SITE_OTHER): Payer: Medicare Other | Admitting: Family Medicine

## 2020-05-31 VITALS — BP 124/81 | HR 61 | Temp 98.1°F | Ht 69.0 in | Wt 161.0 lb

## 2020-05-31 DIAGNOSIS — I2510 Atherosclerotic heart disease of native coronary artery without angina pectoris: Secondary | ICD-10-CM

## 2020-05-31 DIAGNOSIS — E782 Mixed hyperlipidemia: Secondary | ICD-10-CM

## 2020-05-31 DIAGNOSIS — I10 Essential (primary) hypertension: Secondary | ICD-10-CM | POA: Diagnosis not present

## 2020-05-31 DIAGNOSIS — G4700 Insomnia, unspecified: Secondary | ICD-10-CM | POA: Diagnosis not present

## 2020-05-31 MED ORDER — DIAZEPAM 5 MG PO TABS: 5.0000 mg | ORAL_TABLET | Freq: Every evening | ORAL | 1 refills | Status: AC | PRN

## 2020-05-31 MED ORDER — METOPROLOL SUCCINATE ER 25 MG PO TB24: ORAL_TABLET | ORAL | 2 refills | Status: AC

## 2020-05-31 MED ORDER — FOSINOPRIL SODIUM 10 MG PO TABS: 10.0000 mg | ORAL_TABLET | Freq: Every day | ORAL | 2 refills | Status: AC

## 2020-05-31 NOTE — Assessment & Plan Note (Signed)
He has intolerance to statins in the past previously.  Update lipid panel.

## 2020-05-31 NOTE — Assessment & Plan Note (Signed)
Blood pressure is at goal at for age and co-morbidities.  I recommend he continue toprol and fosinopril.  In addition they were instructed to follow a low sodium diet with regular exercise to help to maintain adequate control of blood pressure.

## 2020-05-31 NOTE — Progress Notes (Signed)
Randall Compton - 86 y.o. male MRN [id number]  Date of birth: 03-14-1932  Subjective Chief Complaint  Patient presents with  . Insomnia    HPI Randall Compton is a 86 y.o. male with history of HTN, CAD s/p CABG, CKD, and allergic rhinitis here today for follow up visit.     -HTN:  Current treatement with toprol xl 12.5mg  daily and fosinopril 10mg  daily.  He is doing well with current dose of medications.  He denies symptoms of hypotension.   -CAD:  History of CAD with prior CABG   He is not taking ASA due GI upset with this.  He is not taking statin due to liver inflammation and joint pain with previous use.   He is due to follow up with cardiology and plans to schedule appt with Dr. Gwenlyn Found soon.  He denies anginal symptoms, shortness of breath, palpitations.  -Insomnia:  Has used diazepam 5mg  at bedtime for nearly 30 years.  He denies significant side effects from medication including oversedation or dizziness.   ROS:  A comprehensive ROS was completed and negative except as noted per HPI  Allergies  Allergen Reactions  . Amoxicillin Anaphylaxis and Nausea Only  . Penicillins Anaphylaxis  . Asa [Aspirin] Other (See Comments)    GI upset  . Gluten Meal   . Lovaza [Omega-3-Acid Ethyl Esters]   . Singulair [Montelukast Sodium]     Unknown side effects  . Statins     Liver inflammation and joint pain.  . Sulfa Antibiotics   . Tricor [Fenofibrate] Other (See Comments)    Joint swelling    Past Medical History:  Diagnosis Date  . Ankle pain   . Aortic sclerosis    With mild aortic regurgitation  . Back pain    Chronic  . BPH (benign prostatic hyperplasia)   . Bradycardia 04/07/13   EKG 04/07/13 showed sinus bradycardia at 54 bpm.  . Broken arm    Had pin placed  . Bronchitis 02/02/2004   Chest CT = Heart vascularity are normal. There is evidence of a previous CABG with some tortuosity of the thoracic aorta. There are no infiltrates or effusions, or other acute abnormalities.  [name]  CAD (coronary artery disease)    s/p bypass grafting 06/01/96 with a LIMA to his LAD, a vein to the diagonal branch, a marginal branch, PDA, as well as distal circumflex. ECHO 07/21/96= Normal LV size, wall thickness & global systolic function, EF 88-28%.  Abnormal septal motion compatible with post-operative state w/o major wall motion abnormalities. Aortic sclerosis w/mild aortic regurgitation. Myoview 03/24/12 was non ischemic.  [name] DOE (dyspnea on exertion)   . Elevated cholesterol    Mild, statin intolerant.  . Hyperlipemia    Statin intolerant.  . Hypertension   . Leg pain   . Lower extremity edema    Late in the evening  . MI (myocardial infarction) ([place])    Prior to CABG 06/01/1996  . Mild aortic regurgitation    With aortic sclerosis  . Nocturia    Of 0-1  . Palpitations    Occasional  . Postoperative anemia 06/01/96    Past Surgical History:  Procedure Laterality Date  . CARDIAC CATHETERIZATION  06/01/96   60-70% distal (L) main, totally occluded LAD prior to the take off of the (L) of the diagonal w/thrombus present, multiple 60% lesions in the circumflex, & a totally occluded (R). Had a good collateral support to the distal RCA & distal LAD distribution but  did have significant LV dysfunction w/EF of 30% & significant akinesia in the LAD distribution. Renals, distal aorta & iliacs were normal.   . CHOLECYSTECTOMY    . CORONARY ARTERY BYPASS GRAFT  06/04/96   Dr. Cyndia Bent placed LIMA to the LAD, SVG to the DX, SVG to the OM, & SVG to the PDA - distal CFX.  . OTHER SURGICAL HISTORY     Broken arm, had pin placed    Social History   Socioeconomic History  . Marital status: Married    Spouse name: Not on file  . Number of children: Not on file  . Years of education: Not on file  . Highest education level: Not on file  Occupational History  . Not on file  Tobacco Use  . Smoking status: Former Smoker    Quit date: 12/09/1972    Years since quitting: 47.5  . Smokeless tobacco:  Never Used  Substance and Sexual Activity  . Alcohol use: Yes    Alcohol/week: 2.0 standard drinks    Types: 2 Cans of beer per week    Comment: Not often  . Drug use: No  . Sexual activity: Not on file  Other Topics Concern  . Not on file  Social History Narrative  . Not on file   Social Determinants of Health   Financial Resource Strain:   . Difficulty of Paying Living Expenses:   Food Insecurity:   . Worried About Charity fundraiser in the Last Year:   . Arboriculturist in the Last Year:   Transportation Needs:   . Film/video editor (Medical):   [name] Lack of Transportation (Non-Medical):   Physical Activity:   . Days of Exercise per Week:   . Minutes of Exercise per Session:   Stress:   . Feeling of Stress :   Social Connections:   . Frequency of Communication with Friends and Family:   . Frequency of Social Gatherings with Friends and Family:   . Attends Religious Services:   . Active Member of Clubs or Organizations:   . Attends Archivist Meetings:   [name] Marital Status:     Family History  Problem Relation Age of Onset  . Heart disease Mother        Heart problems in her 57s (Pt did not specify)  . Hypertension Father     Health Maintenance  Topic Date Due  . COVID-19 Vaccine (1) Never done  . INFLUENZA VACCINE  07/09/2020  . TETANUS/TDAP  09/18/2026  . PNA vac Low Risk Adult  Completed     ----------------------------------------------------------------------------------------------------------------------------------------------------------------------------------------------------------------- Physical Exam BP 124/81   Pulse 61   Temp 98.1 F (36.7 C) (Oral)   Ht 5\' 9"  (1.753 m)   Wt 161 lb (73 kg)   SpO2 99% Comment: on RA  BMI 23.78 kg/m   Physical Exam Constitutional:      Appearance: Normal appearance.  HENT:     Head: Normocephalic and atraumatic.  Eyes:     General: No scleral icterus. Cardiovascular:     Rate and  Rhythm: Normal rate and regular rhythm.  Pulmonary:     Effort: Pulmonary effort is normal.     Breath sounds: Normal breath sounds.  Musculoskeletal:     Cervical back: Neck supple.  Skin:    General: Skin is warm and dry.  Neurological:     Mental Status: He is alert.  Psychiatric:        Mood and Affect:  Mood normal.        Behavior: Behavior normal.     ------------------------------------------------------------------------------------------------------------------------------------------------------------------------------------------------------------------- Assessment and Plan  Essential hypertension Blood pressure is at goal at for age and co-morbidities.  I recommend he continue toprol and fosinopril.  In addition they were instructed to follow a low sodium diet with regular exercise to help to maintain adequate control of blood pressure.    Coronary artery disease Denies anginal symptoms at this time.  He plans to schedule follow up with Dr. Gwenlyn Found for routine follow up.    Hyperlipidemia He has intolerance to statins in the past previously.  Update lipid panel.    Insomnia Stable with diazepam.  Discussed precautions with this due to his age.  He denies any side effects and will continue for now.    Meds ordered this encounter  Medications  . fosinopril (MONOPRIL) 10 MG tablet    Sig: Take 1 tablet (10 mg total) by mouth daily.    Dispense:  90 tablet    Refill:  2  . diazepam (VALIUM) 5 MG tablet    Sig: Take 1 tablet (5 mg total) by mouth at bedtime as needed for anxiety.    Dispense:  30 tablet    Refill:  1    Not to exceed 5 additional fills before 03/12/2020  . metoprolol succinate (TOPROL-XL) 25 MG 24 hr tablet    Sig: TAKE 1/2 TABLET BY MOUTH DAILY.    Dispense:  45 tablet    Refill:  2    Return in about 6 months (around 11/30/2020) for HTN.    This visit occurred during the SARS-CoV-2 public health emergency.  Safety protocols were in place,  including screening questions prior to the visit, additional usage of staff PPE, and extensive cleaning of exam room while observing appropriate contact time as indicated for disinfecting solutions.

## 2020-05-31 NOTE — Assessment & Plan Note (Signed)
Stable with diazepam.  Discussed precautions with this due to his age.  He denies any side effects and will continue for now.

## 2020-05-31 NOTE — Assessment & Plan Note (Signed)
Denies anginal symptoms at this time.  He plans to schedule follow up with Dr. Gwenlyn Found for routine follow up.

## 2020-05-31 NOTE — Patient Instructions (Signed)
Nice to meet you today! Medications have been refilled.  Have labs completed, we'll be in touch with results.  Follow up with me in 6 months.

## 2020-06-01 ENCOUNTER — Other Ambulatory Visit: Payer: Self-pay | Admitting: Family Medicine

## 2020-06-24 ENCOUNTER — Other Ambulatory Visit: Payer: Self-pay | Admitting: Family Medicine

## 2020-07-19 ENCOUNTER — Other Ambulatory Visit: Payer: Self-pay | Admitting: Family Medicine

## 2020-08-09 ENCOUNTER — Other Ambulatory Visit: Payer: Self-pay

## 2020-08-09 ENCOUNTER — Emergency Department (INDEPENDENT_AMBULATORY_CARE_PROVIDER_SITE_OTHER)
Admission: EM | Admit: 2020-08-09 | Discharge: 2020-08-09 | Disposition: A | Discharge status: Home / Self Care | Payer: Medicare Other | Source: Home / Self Care

## 2020-08-09 DIAGNOSIS — K047 Periapical abscess without sinus: Secondary | ICD-10-CM | POA: Diagnosis not present

## 2020-08-09 DIAGNOSIS — R590 Localized enlarged lymph nodes: Secondary | ICD-10-CM

## 2020-08-09 MED ORDER — AZITHROMYCIN 250 MG PO TABS
250.0000 mg | ORAL_TABLET | Freq: Every day | ORAL | 0 refills | Status: AC
Start: 2020-08-09 — End: 2020-08-14

## 2020-08-09 MED ORDER — CHLORHEXIDINE GLUCONATE 0.12 % MT SOLN: 15.0000 mL | Freq: Two times a day (BID) | OROMUCOSAL | 0 refills | Status: AC

## 2020-08-09 NOTE — ED Triage Notes (Signed)
Pt reports h/o of dental issues.  One tooth in bottom front has broken off, appears gray and pt reports soreness.  Started having dental pain and left jaw swelling this morning.  Pain is primarily when talking or touching tooth/jaw. Pt is more concerned about the swelling along the left jaw.  Took Tylenol this morning with relief.   Has been to multiple dentists but can not afford the procedures they want to do.    When screened for SI, states "no, not really, not seriously".  When questioned further pt states "i'll just say no to that one" and states he has been feeling depressed due to mounting pressures in personal life.

## 2020-08-09 NOTE — Discharge Instructions (Addendum)
Contact dental office listed above. Complete all medication as prescribed. Return for evaluation of if symptoms worsen or you develop fever.
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
## 2020-09-01 ENCOUNTER — Encounter: Payer: Self-pay | Admitting: Emergency Medicine

## 2020-09-01 ENCOUNTER — Other Ambulatory Visit: Payer: Self-pay

## 2020-09-01 ENCOUNTER — Emergency Department (INDEPENDENT_AMBULATORY_CARE_PROVIDER_SITE_OTHER): Payer: Medicare Other

## 2020-09-01 ENCOUNTER — Emergency Department
Admission: EM | Admit: 2020-09-01 | Discharge: 2020-09-01 | Disposition: A | Discharge status: Home / Self Care | Payer: Medicare Other | Source: Home / Self Care

## 2020-09-01 DIAGNOSIS — K047 Periapical abscess without sinus: Secondary | ICD-10-CM

## 2020-09-01 DIAGNOSIS — R634 Abnormal weight loss: Secondary | ICD-10-CM

## 2020-09-01 DIAGNOSIS — R221 Localized swelling, mass and lump, neck: Secondary | ICD-10-CM

## 2020-09-01 DIAGNOSIS — R590 Localized enlarged lymph nodes: Secondary | ICD-10-CM

## 2020-09-01 DIAGNOSIS — K029 Dental caries, unspecified: Secondary | ICD-10-CM

## 2020-09-01 MED ORDER — CLINDAMYCIN HCL 150 MG PO CAPS: 300.0000 mg | ORAL_CAPSULE | Freq: Three times a day (TID) | ORAL | 0 refills | Status: AC

## 2020-09-01 NOTE — Discharge Instructions (Addendum)
  You do have some swelling in your neck that could be due to a small abscess.  Additional imaging, a CT scan has been recommended.  You may start the new antibiotic today but it is recommended you follow up with your primary care provider next week for further evaluation with a CT scan and recheck of symptoms including further evaluation of the unintended weight loss over the last 3-4 months.   Be sure to schedule a follow up appointment with a dentist using the dental resource guide provided today.  Having a dentist treat the problem teeth will help prevent recurrent infection.    Call 911 or have someone drive you to the hospital if symptoms significantly worsening including trouble breathing or swallowing.

## 2020-09-01 NOTE — ED Triage Notes (Signed)
Neck/jaw swelling from broken tooth, no pain but still swollen. Hasn't seen dentist wanted this to clear up completely first.
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
## 2020-10-09 DEATH — deceased

## 2022-04-15 IMAGING — DX DG NECK SOFT TISSUE
2 series · 2 of 2 positions shown · non-contrast
Comparison: None.

CLINICAL DATA: Neck swelling. Patient reports broken tooth with
subsequent abscess. Left-sided neck swelling.

EXAM:
NECK SOFT TISSUES - 1+ VIEW

[neck lat]
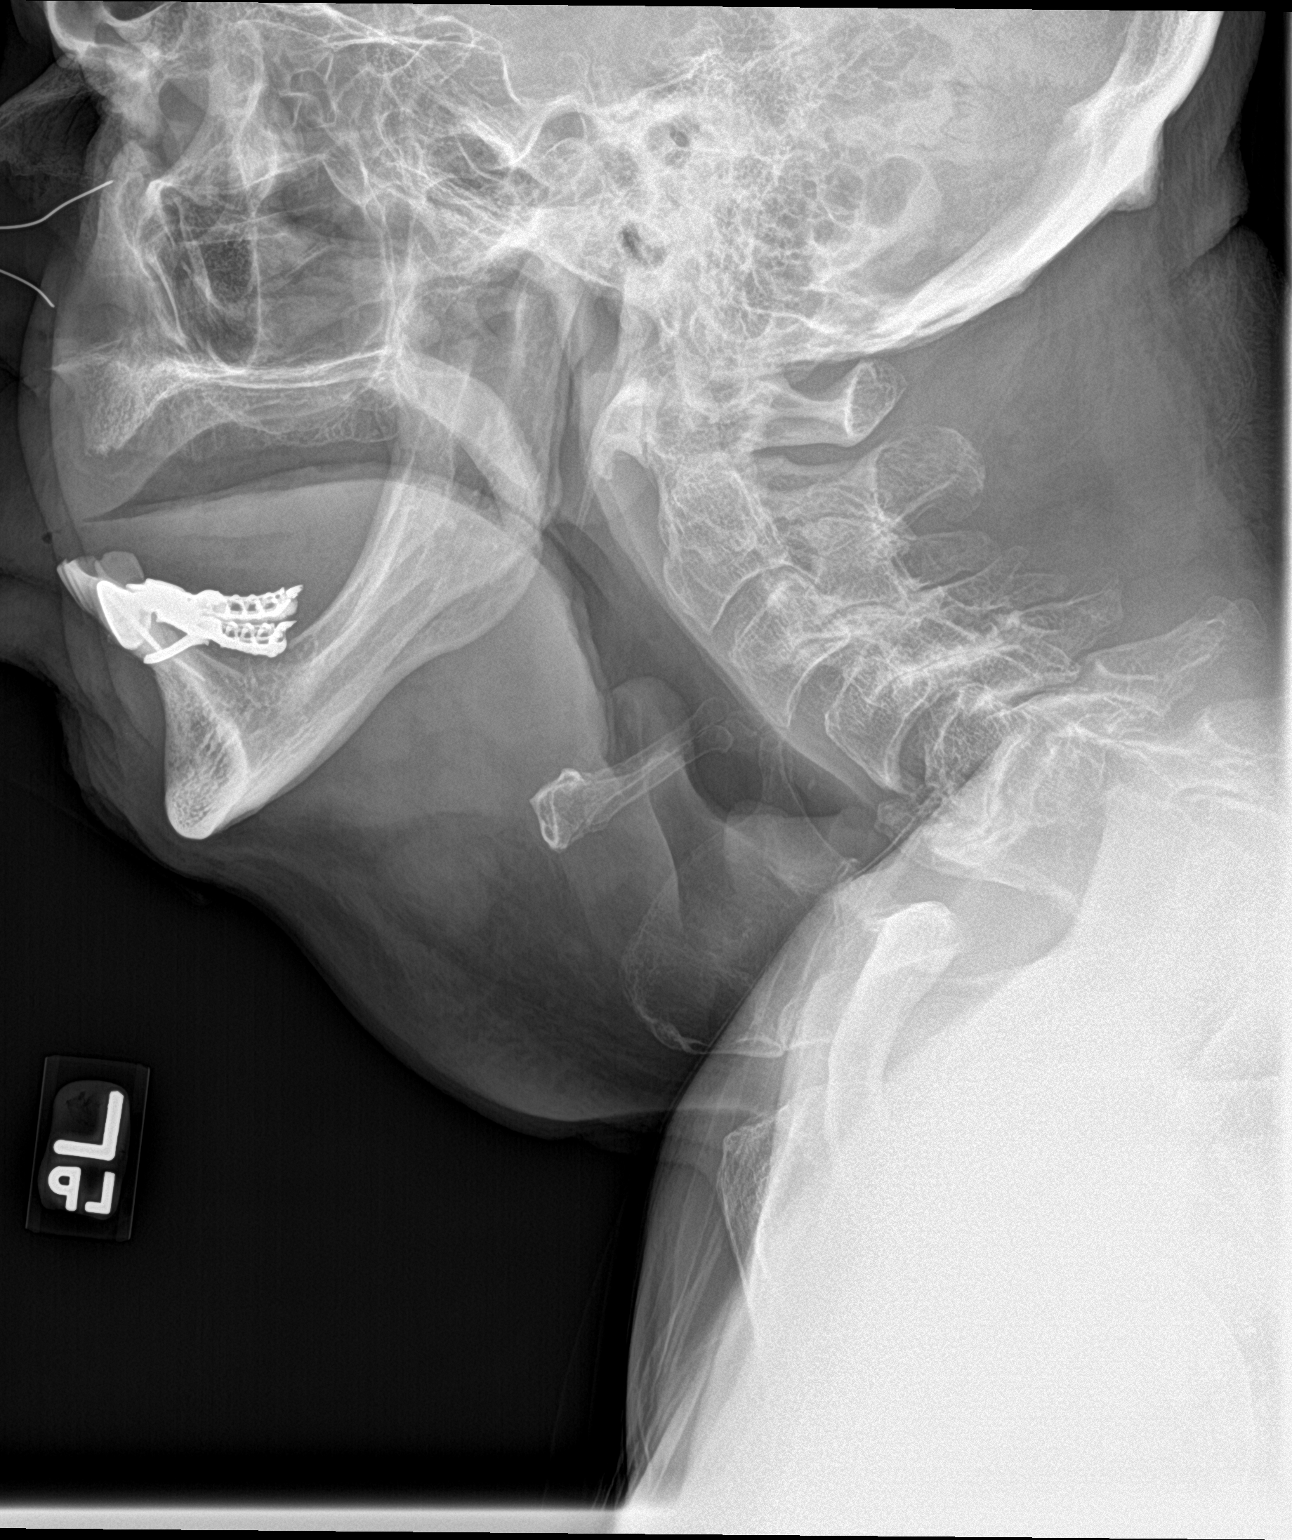

[neck ap]
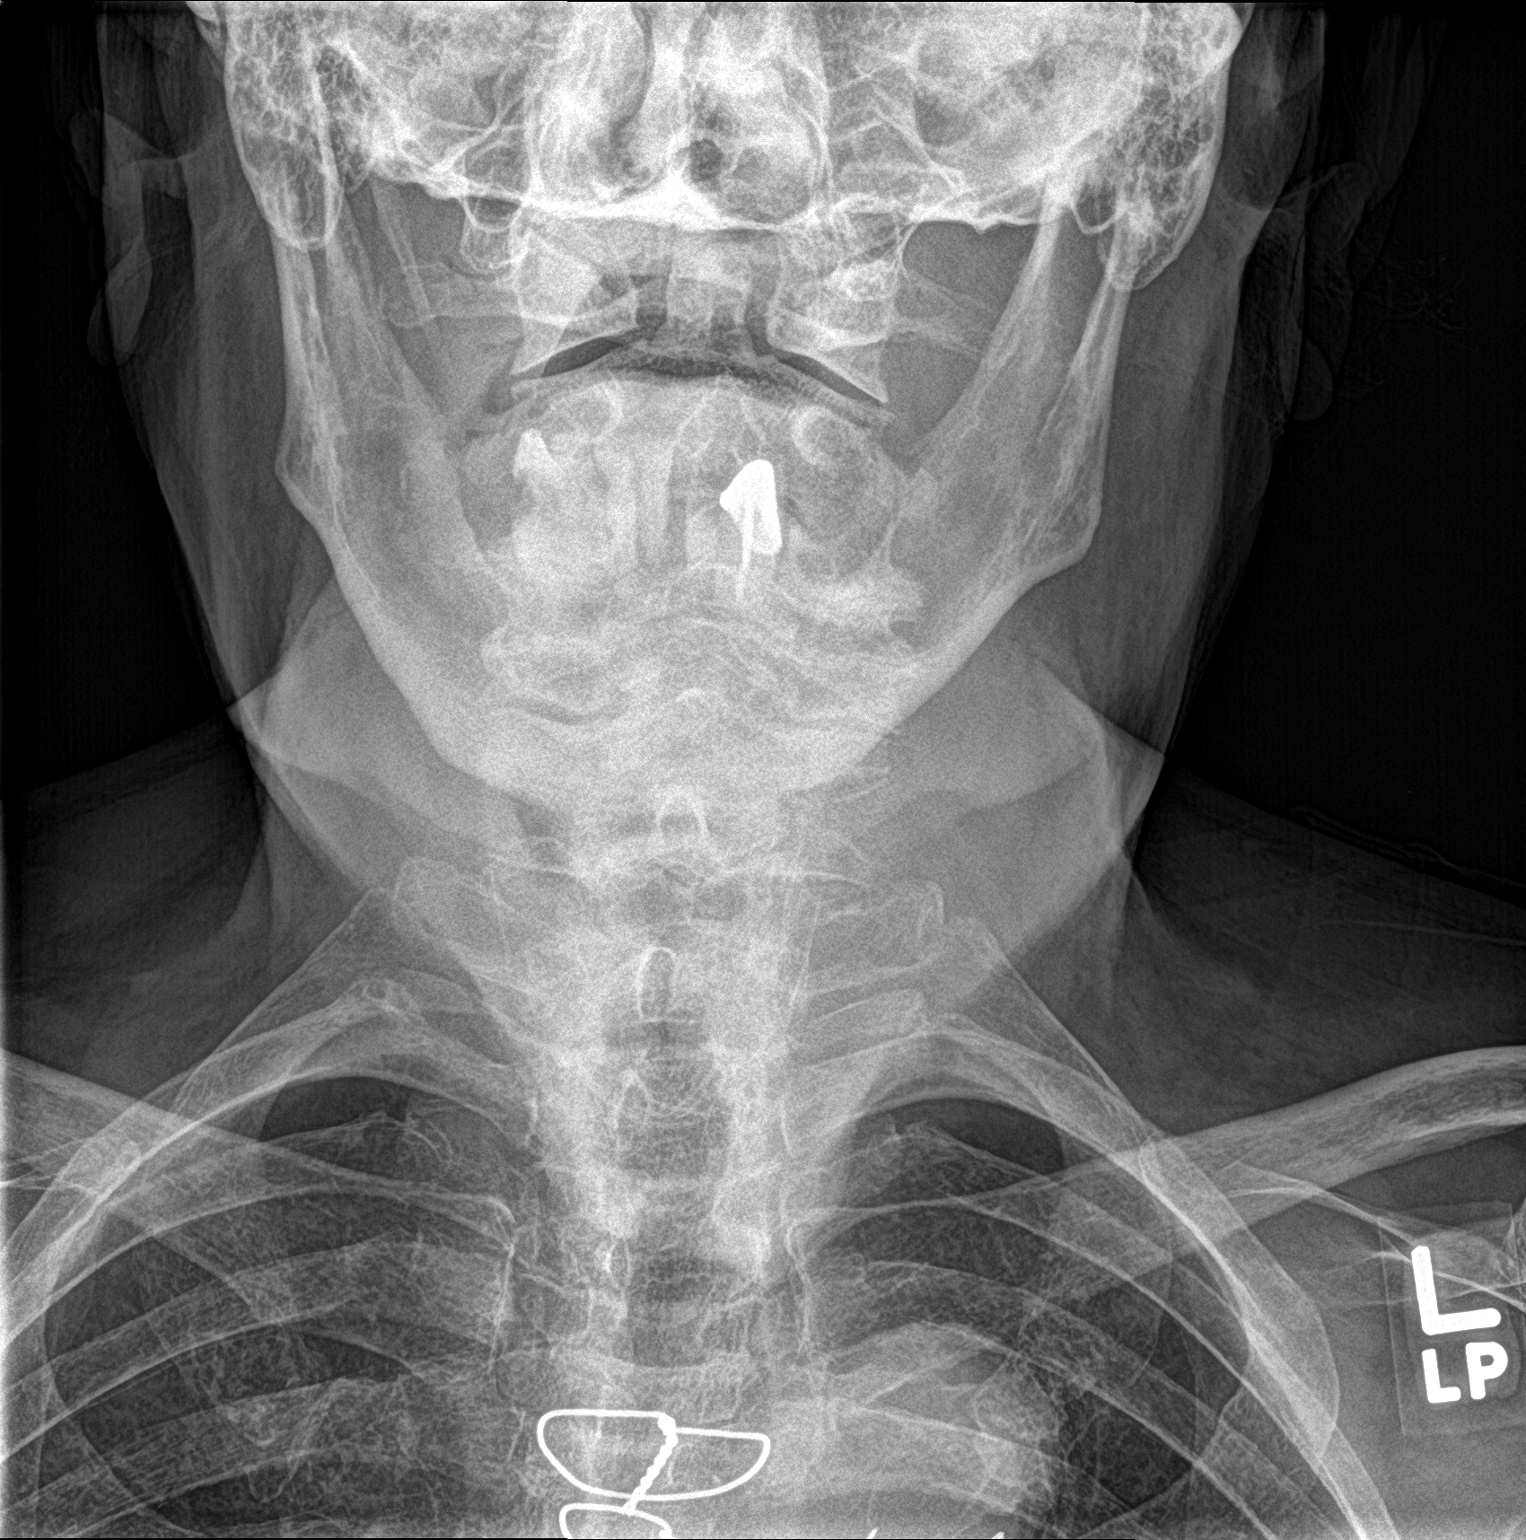

[2 of 2 positions shown; findings below may reference images not displayed]

FINDINGS: There is no evidence of retropharyngeal soft tissue swelling or
epiglottic enlargement. The cervical airway is unremarkable.
Edentulous of upper teeth. Partial lower dentures. There is soft
tissue prominence and questionable edema anteriorly in the
subcutaneous tissues. No soft tissue air. Lung apices are clear.
IMPRESSION: Soft tissue prominence and questionable edema anteriorly in the
subcutaneous tissues of the lower neck. No soft tissue air. If there
is clinical concern for abscess, recommend further evaluation with
CT.
